# Patient Record
Sex: Female | Born: 1970 | Race: White | Hispanic: No | Marital: Married | Smoking: Never smoker
Health system: Southern US, Community
[De-identification: ages and names within clinical notes are randomized; demographics above are authoritative.]

## PROBLEM LIST (undated history)

## (undated) ENCOUNTER — Ambulatory Visit: Payer: Self-pay

## (undated) DIAGNOSIS — G43909 Migraine, unspecified, not intractable, without status migrainosus: Secondary | ICD-10-CM

## (undated) DIAGNOSIS — F419 Anxiety disorder, unspecified: Secondary | ICD-10-CM

## (undated) DIAGNOSIS — N809 Endometriosis, unspecified: Secondary | ICD-10-CM

## (undated) DIAGNOSIS — N951 Menopausal and female climacteric states: Secondary | ICD-10-CM

## (undated) DIAGNOSIS — R102 Pelvic and perineal pain: Secondary | ICD-10-CM

## (undated) DIAGNOSIS — M041 Periodic fever syndromes: Secondary | ICD-10-CM

## (undated) HISTORY — DX: Endometriosis, unspecified: N80.9

## (undated) HISTORY — PX: ABDOMINAL HYSTERECTOMY: SHX81

## (undated) HISTORY — PX: LAPAROSCOPIC LYSIS OF ADHESIONS: SHX5905

## (undated) HISTORY — PX: APPENDECTOMY: SHX54

## (undated) HISTORY — PX: CHOLECYSTECTOMY: SHX55

## (undated) HISTORY — DX: Pelvic and perineal pain: R10.2

## (undated) HISTORY — DX: Periodic fever syndromes: M04.1

## (undated) HISTORY — DX: Anxiety disorder, unspecified: F41.9

## (undated) HISTORY — DX: Menopausal and female climacteric states: N95.1

---

## 2004-10-19 ENCOUNTER — Inpatient Hospital Stay: Payer: Self-pay | Admitting: Surgery

## 2005-07-28 ENCOUNTER — Emergency Department: Payer: Self-pay | Admitting: Emergency Medicine

## 2005-07-28 ENCOUNTER — Emergency Department (HOSPITAL_COMMUNITY): Admission: EM | Admit: 2005-07-28 | Discharge: 2005-07-29 | Payer: Self-pay | Admitting: Emergency Medicine

## 2007-07-24 ENCOUNTER — Ambulatory Visit: Payer: Self-pay | Admitting: Unknown Physician Specialty

## 2007-07-30 ENCOUNTER — Ambulatory Visit: Payer: Self-pay | Admitting: Unknown Physician Specialty

## 2008-06-21 ENCOUNTER — Ambulatory Visit: Payer: Self-pay | Admitting: Gastroenterology

## 2011-09-17 ENCOUNTER — Ambulatory Visit: Payer: Self-pay | Admitting: Obstetrics and Gynecology

## 2012-03-28 ENCOUNTER — Emergency Department: Payer: Self-pay | Admitting: *Deleted

## 2012-12-09 ENCOUNTER — Ambulatory Visit: Payer: Self-pay | Admitting: Internal Medicine

## 2012-12-28 ENCOUNTER — Ambulatory Visit: Payer: Self-pay | Admitting: Gastroenterology

## 2012-12-30 LAB — PATHOLOGY REPORT

## 2013-01-28 LAB — HM PAP SMEAR: HM Pap smear: NEGATIVE

## 2013-08-11 HISTORY — PX: OTHER SURGICAL HISTORY: SHX169

## 2013-08-31 ENCOUNTER — Ambulatory Visit: Payer: Self-pay | Admitting: Obstetrics and Gynecology

## 2013-08-31 LAB — CBC
HCT: 44.6 % (ref 35.0–47.0)
MCHC: 34.8 g/dL (ref 32.0–36.0)
MCV: 89 fL (ref 80–100)
Platelet: 276 10*3/uL (ref 150–440)
RBC: 5.04 10*6/uL (ref 3.80–5.20)
WBC: 6.5 10*3/uL (ref 3.6–11.0)

## 2013-08-31 LAB — BASIC METABOLIC PANEL
Chloride: 105 mmol/L (ref 98–107)
Co2: 30 mmol/L (ref 21–32)
Creatinine: 0.62 mg/dL (ref 0.60–1.30)
EGFR (African American): >60
EGFR (Non-African Amer.): >60
Glucose: 74 mg/dL (ref 65–99)

## 2013-09-06 ENCOUNTER — Ambulatory Visit: Payer: Self-pay | Admitting: Obstetrics and Gynecology

## 2013-09-07 LAB — HEMOGLOBIN: HGB: 12.6 g/dL (ref 12.0–16.0)

## 2013-09-08 LAB — PATHOLOGY REPORT

## 2013-09-11 ENCOUNTER — Emergency Department: Payer: Self-pay | Admitting: Emergency Medicine

## 2013-09-11 LAB — COMPREHENSIVE METABOLIC PANEL
BUN: 8 mg/dL (ref 7–18)
Co2: 30 mmol/L (ref 21–32)
EGFR (African American): >60
EGFR (Non-African Amer.): >60
Glucose: 85 mg/dL (ref 65–99)
Osmolality: 270 (ref 275–301)
Sodium: 136 mmol/L (ref 136–145)
Total Protein: 6.7 g/dL (ref 6.4–8.2)

## 2013-09-11 LAB — CBC
HGB: 12.7 g/dL (ref 12.0–16.0)
MCH: 30.4 pg (ref 26.0–34.0)
MCHC: 34.9 g/dL (ref 32.0–36.0)
MCV: 87 fL (ref 80–100)
Platelet: 232 10*3/uL (ref 150–440)
RBC: 4.19 10*6/uL (ref 3.80–5.20)
RDW: 12.9 % (ref 11.5–14.5)

## 2013-09-11 LAB — LIPASE, BLOOD: Lipase: 90 U/L (ref 73–393)

## 2013-09-11 LAB — URINALYSIS, COMPLETE: Protein: NEGATIVE

## 2013-09-11 LAB — WET PREP, GENITAL

## 2014-02-08 ENCOUNTER — Emergency Department: Payer: Self-pay | Admitting: Emergency Medicine

## 2015-02-20 LAB — HM MAMMOGRAPHY

## 2015-03-03 NOTE — Op Note (Signed)
PATIENT NAME:  Heidi Pitts, [name] MR#:  [id number] DATE OF BIRTH:  13-Mar-1971  DATE OF PROCEDURE:  09/06/2013  PREOPERATIVE DIAGNOSES:  Chronic pelvic pain secondary to endometriosis.   POSTOPERATIVE DIAGNOSIS:  Chronic pelvic pain secondary to endometriosis.   OPERATION:  Vaginal trachelectomy.   SURGEON: Prentice DockerMartin A. Rhyder Koegel, M.D.   FIRST ASSISTANT: Dr. Valentino Saxonherry and Ardeen JourdainSeth Abel PA-S.   ANESTHESIA: General LMA.   INDICATIONS: The patient is a 44 year old white female, previously status post LSH/RSO for symptomatic endometriosis, presents now for further surgical management of chronic pelvic pain. She has developed vaginal bleeding and pelvic pain associated to bleeding. She desires treatment through trachelectomy.   FINDINGS AT SURGERY: Revealed a grossly normal-appearing cervix.   DESCRIPTION OF PROCEDURE: The patient was brought to the Operating Room where she was placed in the supine position. General anesthesia was induced without difficulty. The LMA technique was used. The patient was placed in the dorsal lithotomy position and a Betadine perineal, intravaginal prep and drape was performed in standard fashion. A Foley catheter was placed into the bladder and was draining clear yellow urine. A weighted speculum was placed into the vagina and double-tooth tenaculum was placed onto the cervix. Posterior colpotomy was made with Mayo scissors. Uterosacral ligaments were clamped, cut, and stick tied. These were tagged. The posterior cuff was run with a running locking stitch of 0 Vicryl suture. The cervix was circumscribed with the Bovie cautery. The bladder was dissected off the cervix through sharp and blunt dissection. Sequentially, the remaining cardinal broad ligament complexes were clamped, cut, and stick tied. Cervix was removed from the operative field. No significant pelvic adhesions were identified. The vagina was then closed using simple interrupted technique of 2-0 chromic suture. Upon  completion of the procedure,  all instrumentation was removed from the vagina. The patient was then awakened, mobilized, and taken to the recovery room in satisfactory condition.   ESTIMATED BLOOD LOSS: 75 mL   IV FLUIDS: Were 1 liter.  URINE OUTPUT: Not quantified at the time of this dictation.   All instruments, needle, and sponge counts were verified as correct. The patient did receive clindamycin and gentamicin antibiotic prophylaxis.   ____________________________ Prentice DockerMartin A. Mayumi Summerson, MD mad:cc D: 09/06/2013 16:44:01 ET T: 09/07/2013 00:43:23 ET JOB#: [id number]  cc: Daphine DeutscherMartin A. Raychelle Hudman, MD, <Dictator> [facility] Prentice DockerMARTIN A Onyekachi Gathright MD ELECTRONICALLY SIGNED 09/07/2013 13:33

## 2015-10-31 ENCOUNTER — Ambulatory Visit
Admit: 2015-10-31 | Discharge: 2015-10-31 | Disposition: A | Discharge status: Home / Self Care | Payer: BC Managed Care – PPO | Attending: Family Medicine | Admitting: Family Medicine

## 2015-10-31 ENCOUNTER — Ambulatory Visit
Admission: EM | Admit: 2015-10-31 | Discharge: 2015-10-31 | Disposition: A | Discharge status: Home / Self Care | Payer: BC Managed Care – PPO | Attending: Family Medicine | Admitting: Family Medicine

## 2015-10-31 ENCOUNTER — Encounter: Payer: Self-pay | Admitting: *Deleted

## 2015-10-31 DIAGNOSIS — B9689 Other specified bacterial agents as the cause of diseases classified elsewhere: Secondary | ICD-10-CM

## 2015-10-31 DIAGNOSIS — K5900 Constipation, unspecified: Secondary | ICD-10-CM | POA: Insufficient documentation

## 2015-10-31 DIAGNOSIS — K5730 Diverticulosis of large intestine without perforation or abscess without bleeding: Secondary | ICD-10-CM | POA: Diagnosis not present

## 2015-10-31 DIAGNOSIS — Z90710 Acquired absence of both cervix and uterus: Secondary | ICD-10-CM | POA: Insufficient documentation

## 2015-10-31 DIAGNOSIS — A499 Bacterial infection, unspecified: Secondary | ICD-10-CM

## 2015-10-31 DIAGNOSIS — R1032 Left lower quadrant pain: Secondary | ICD-10-CM | POA: Insufficient documentation

## 2015-10-31 DIAGNOSIS — N760 Acute vaginitis: Secondary | ICD-10-CM | POA: Insufficient documentation

## 2015-10-31 DIAGNOSIS — K7689 Other specified diseases of liver: Secondary | ICD-10-CM | POA: Diagnosis not present

## 2015-10-31 HISTORY — DX: Migraine, unspecified, not intractable, without status migrainosus: G43.909

## 2015-10-31 LAB — WET PREP, GENITAL
Sperm: NONE SEEN
TRICH WET PREP: NONE SEEN
YEAST WET PREP: NONE SEEN

## 2015-10-31 LAB — COMPREHENSIVE METABOLIC PANEL
ALBUMIN: 4.6 g/dL (ref 3.5–5.0)
ALT: 12 U/L — ABNORMAL LOW (ref 14–54)
ANION GAP: 9 (ref 5–15)
AST: 18 U/L (ref 15–41)
Alkaline Phosphatase: 59 U/L (ref 38–126)
BILIRUBIN TOTAL: 0.5 mg/dL (ref 0.3–1.2)
BUN: 12 mg/dL (ref 6–20)
CHLORIDE: 104 mmol/L (ref 101–111)
CO2: 25 mmol/L (ref 22–32)
Calcium: 9.3 mg/dL (ref 8.9–10.3)
Creatinine, Ser: 0.59 mg/dL (ref 0.44–1.00)
GFR calc Af Amer: >60 mL/min (ref >60)
Glucose, Bld: 96 mg/dL (ref 65–99)
POTASSIUM: 3.6 mmol/L (ref 3.5–5.1)
Sodium: 138 mmol/L (ref 135–145)
TOTAL PROTEIN: 7.5 g/dL (ref 6.5–8.1)

## 2015-10-31 LAB — URINALYSIS COMPLETE WITH MICROSCOPIC (ARMC ONLY)
BILIRUBIN URINE: NEGATIVE
Bacteria, UA: NONE SEEN
Glucose, UA: NEGATIVE mg/dL
HGB URINE DIPSTICK: NEGATIVE
KETONES UR: NEGATIVE mg/dL
LEUKOCYTES UA: NEGATIVE
NITRITE: NEGATIVE
PH: 7.5 (ref 5.0–8.0)
PROTEIN: NEGATIVE mg/dL
SPECIFIC GRAVITY, URINE: 1.02 (ref 1.005–1.030)
WBC, UA: NONE SEEN WBC/hpf (ref 0–5)

## 2015-10-31 LAB — LIPASE, BLOOD: Lipase: 25 U/L (ref 11–51)

## 2015-10-31 LAB — AMYLASE: AMYLASE: 45 U/L (ref 28–100)

## 2015-10-31 LAB — CBC WITH DIFFERENTIAL/PLATELET
BASOS ABS: 0 10*3/uL (ref 0–0.1)
BASOS PCT: 1 %
EOS PCT: 1 %
Eosinophils Absolute: 0.1 10*3/uL (ref 0–0.7)
HEMATOCRIT: 39 % (ref 35.0–47.0)
Hemoglobin: 13.2 g/dL (ref 12.0–16.0)
Lymphocytes Relative: 13 %
Lymphs Abs: 0.9 10*3/uL — ABNORMAL LOW (ref 1.0–3.6)
MCH: 30.5 pg (ref 26.0–34.0)
MCHC: 33.7 g/dL (ref 32.0–36.0)
MCV: 90.3 fL (ref 80.0–100.0)
MONO ABS: 0.4 10*3/uL (ref 0.2–0.9)
MONOS PCT: 5 %
NEUTROS ABS: 5.5 10*3/uL (ref 1.4–6.5)
Neutrophils Relative %: 80 %
PLATELETS: 189 10*3/uL (ref 150–440)
RBC: 4.32 MIL/uL (ref 3.80–5.20)
RDW: 13.2 % (ref 11.5–14.5)
WBC: 6.8 10*3/uL (ref 3.6–11.0)

## 2015-10-31 LAB — CHLAMYDIA/NGC RT PCR (ARMC ONLY)
Chlamydia Tr: NOT DETECTED
N gonorrhoeae: NOT DETECTED

## 2015-10-31 MED ORDER — KETOROLAC TROMETHAMINE 60 MG/2ML IM SOLN
30.0000 mg | Freq: Once | INTRAMUSCULAR | Status: AC
  Administered 2015-10-31: 30 mg via INTRAMUSCULAR

## 2015-10-31 MED ORDER — MELOXICAM 15 MG PO TABS: 15.0000 mg | ORAL_TABLET | Freq: Every day | ORAL | Status: DC

## 2015-10-31 MED ORDER — IOHEXOL 300 MG/ML  SOLN
100.0000 mL | Freq: Once | INTRAMUSCULAR | Status: AC | PRN
  Administered 2015-10-31: 100 mL via INTRAVENOUS

## 2015-10-31 MED ORDER — HYDROCODONE-ACETAMINOPHEN 5-325 MG PO TABS: 1.0000 | ORAL_TABLET | Freq: Three times a day (TID) | ORAL | Status: DC | PRN

## 2015-10-31 MED ORDER — METRONIDAZOLE 500 MG PO TABS: 500.0000 mg | ORAL_TABLET | Freq: Two times a day (BID) | ORAL | Status: DC

## 2015-10-31 NOTE — ED Provider Notes (Signed)
CSN: [id number]     Arrival date & time 10/31/15  1058 History   First MD Initiated Contact with Patient 10/31/15 1159     Chief Complaint  Patient presents with  . Back Pain    lower left side   (Consider location/radiation/quality/duration/timing/severity/associated sxs/prior Treatment) HPI   Heidi Pitts is a 44 y.o. female presenting for L back pain x3 days.  Pain is constant, sharp and cramping and came on suddenly 3 days ago.  Walking seems to make pain more tolerable, but tylenol, advil, resting have not improved pain.  Pain is worse with laying down on back.  She thinks she is urinating a little more frequently than usual with no increase in volume.  She saw blood in toilet with urine yesterday, but thinks this may have come from a hemorrhoid that she knows she has. She has been having loose stools for about 3 wks, which she thinks is related to stress, as this commonly happens to her.  She has had nausea this AM and vomited once.  Good PO intake. Patient denies any fever, dysuria, abd pain (but reports that pain feels like it is between mid left abd and back).  She has previously had complete hysterectomy (left ovary remains), appendectomy, and cholecystectomy.  Denies any history of nephrolithiasis or pyelonephritis.  Past Medical History  Diagnosis Date  . Migraines    Past Surgical History  Procedure Laterality Date  . Appendectomy    . Cholecystectomy    . Abdominal hysterectomy     History reviewed. No pertinent family history. Social History  Substance Use Topics  . Smoking status: Never Smoker   . Smokeless tobacco: Never Used  . Alcohol Use: No   OB History    No data available     Review of Systems  Constitutional: Negative for fever and chills.  HENT: Negative.   Eyes: Negative.   Respiratory: Negative for cough, chest tightness, shortness of breath and wheezing.   Cardiovascular: Negative for chest pain and leg swelling.  Gastrointestinal: Positive  for nausea, vomiting, diarrhea and blood in stool. Negative for abdominal pain and constipation.  Endocrine: Negative.   Genitourinary: Positive for frequency. Negative for dysuria, urgency, hematuria, vaginal bleeding, vaginal discharge and pelvic pain.  Musculoskeletal: Positive for back pain. Negative for myalgias, joint swelling, neck pain and neck stiffness.  Skin: Negative for rash.  All other systems reviewed and are negative.   Allergies  Doxycycline; Fish allergy; and Penicillins  Home Medications   Prior to Admission medications   Medication Sig Start Date End Date Taking? Authorizing Provider  promethazine (PHENERGAN) 25 MG tablet Take 25 mg by mouth every 6 (six) hours as needed for nausea or vomiting.   Yes Historical Provider, MD  ZOLMitriptan (ZOMIG) 2.5 MG tablet Take 2.5 mg by mouth as needed for migraine or headache. May repeat in 2 hours if headache persists or recurs.   Yes Historical Provider, MD  metroNIDAZOLE (FLAGYL) 500 MG tablet Take 1 tablet (500 mg total) by mouth 2 (two) times daily. 10/31/15   Erasmo Downer, MD   Meds Ordered and Administered this Visit   Medications  ketorolac (TORADOL) injection 30 mg (not administered)    BP 130/77 mmHg  Pulse 70  Temp(Src) 98 F (36.7 C) (Oral)  Resp 20  Ht  (1.626 m)  Wt 142 lb (64.411 kg)  BMI 24.36 kg/m2  SpO2 100% No data found.   Physical Exam  Constitutional: She is oriented to  person, place, and time. She appears well-developed and well-nourished. No distress.  HENT:  Head: Normocephalic and atraumatic.  Mouth/Throat: Oropharynx is clear and moist.  Eyes: Conjunctivae and EOM are normal. Pupils are equal, round, and reactive to light.  Neck: Normal range of motion. Neck supple.  Cardiovascular: Normal rate, regular rhythm, normal heart sounds and intact distal pulses.   No murmur heard. Pulmonary/Chest: Effort normal and breath sounds normal. No respiratory distress. She has no wheezes.   Abdominal: Soft. Bowel sounds are normal. She exhibits no distension. There is no rebound and no guarding.  TTP in LLQ No CVA Tenderness  Genitourinary:  External genitalia within normal limits.  Vaginal mucosa pink, moist, normal rugae.  No cervix present, no discharge or bleeding noted on speculum exam.  Bimanual exam revealed no adnexal masses or tenderness bilaterally.    Musculoskeletal:  TTP over L mid back, no CVA Tenderness  Lymphadenopathy:    She has no cervical adenopathy.  Neurological: She is alert and oriented to person, place, and time.  Skin: Skin is warm and dry. No rash noted.  Psychiatric: She has a normal mood and affect. Her behavior is normal.    ED Course  Procedures (including critical care time)  Labs Review Labs Reviewed  WET PREP, GENITAL - Abnormal; Notable for the following:    Clue Cells Wet Prep HPF POC FEW (*)    WBC, Wet Prep HPF POC FEW (*)    All other components within normal limits  URINALYSIS COMPLETEWITH MICROSCOPIC ([facility] ONLY) - Abnormal; Notable for the following:    Squamous Epithelial / LPF 0-5 (*)    All other components within normal limits  CBC WITH DIFFERENTIAL/PLATELET - Abnormal; Notable for the following:    Lymphs Abs 0.9 (*)    All other components within normal limits  COMPREHENSIVE METABOLIC PANEL - Abnormal; Notable for the following:    ALT 12 (*)    All other components within normal limits  CHLAMYDIA/NGC RT PCR ([facility] ONLY)  AMYLASE  LIPASE, BLOOD    CBC : 6.8>13.2/39<189  Imaging Review No results found.   Visual Acuity Review  Right Eye Distance:   Left Eye Distance:   Bilateral Distance:    Right Eye Near:   Left Eye Near:    Bilateral Near:         MDM   1. LLQ abdominal pain   2. Bacterial vaginosis     No evidence of UTI, pyelo, nephrolithiasis given neg UA and persistent LLQ abd TTP on exam.  Concern for possible ovarian cyst vs diverticulitis.  CBC, CMP, lipase, amylase  unremarkable.  Will send for outpatient CT abd/pelvis with contrast to evaluate for diverticulitis and L ovarian cyst.  VSS and afebrile currently.  IM Toradol 30mg  x1 given prior to discharge from Urgent Care.    Incidentally noted BV on Wet prep, will treat with Flagyl x7d.  GC/CT pending, will follow-up - though of note, patient could not have PID causing pain given remote hysterectomy.    Erasmo DownerAngela M Lakeisa Heninger, MD, MPH PGY-2,  [facility] 10/31/2015 1:40 PM    Erasmo DownerAngela M Ren Aspinall, MD 10/31/15 [phone]

## 2015-10-31 NOTE — Discharge Instructions (Signed)

## 2015-10-31 NOTE — ED Notes (Signed)
Patient started having sudden back pain on the left side which radiates to the front 3 days ago. Patient reports possible blood in urine but is unsure due to actively bleeding hemorrhoids. No history of kidney stones.

## 2015-10-31 NOTE — ED Provider Notes (Addendum)
CSN: [id number]     Arrival date & time 10/31/15  1058 History   First MD Initiated Contact with Patient 10/31/15 1159     Chief Complaint  Patient presents with  . Back Pain    lower left side   (Consider location/radiation/quality/duration/timing/severity/associated sxs/prior Treatment) HPI  Past Medical History  Diagnosis Date  . Migraines    Past Surgical History  Procedure Laterality Date  . Appendectomy    . Cholecystectomy    . Abdominal hysterectomy     History reviewed. No pertinent family history. Social History  Substance Use Topics  . Smoking status: Never Smoker   . Smokeless tobacco: Never Used  . Alcohol Use: No   OB History    No data available     Review of Systems  Allergies  Doxycycline; Fish allergy; and Penicillins  Home Medications   Prior to Admission medications   Medication Sig Start Date End Date Taking? Authorizing Provider  promethazine (PHENERGAN) 25 MG tablet Take 25 mg by mouth every 6 (six) hours as needed for nausea or vomiting.   Yes Historical Provider, MD  ZOLMitriptan (ZOMIG) 2.5 MG tablet Take 2.5 mg by mouth as needed for migraine or headache. May repeat in 2 hours if headache persists or recurs.   Yes Historical Provider, MD  HYDROcodone-acetaminophen (NORCO) 5-325 MG tablet Take 1 tablet by mouth every 8 (eight) hours as needed for moderate pain. 10/31/15   Hassan Rowan, MD  meloxicam (MOBIC) 15 MG tablet Take 1 tablet (15 mg total) by mouth daily. 10/31/15   Hassan Rowan, MD  metroNIDAZOLE (FLAGYL) 500 MG tablet Take 1 tablet (500 mg total) by mouth 2 (two) times daily. 10/31/15   Erasmo Downer, MD   Meds Ordered and Administered this Visit   Medications  ketorolac (TORADOL) injection 30 mg (30 mg Intramuscular Given 10/31/15 1341)    BP 130/77 mmHg  Pulse 70  Temp(Src) 98 F (36.7 C) (Oral)  Resp 20  Ht  (1.626 m)  Wt 142 lb (64.411 kg)  BMI 24.36 kg/m2  SpO2 100% No data found.   Physical Exam  ED  Course  Procedures (including critical care time)  Labs Review Labs Reviewed  WET PREP, GENITAL - Abnormal; Notable for the following:    Clue Cells Wet Prep HPF POC FEW (*)    WBC, Wet Prep HPF POC FEW (*)    All other components within normal limits  URINALYSIS COMPLETEWITH MICROSCOPIC ([facility] ONLY) - Abnormal; Notable for the following:    Squamous Epithelial / LPF 0-5 (*)    All other components within normal limits  CBC WITH DIFFERENTIAL/PLATELET - Abnormal; Notable for the following:    Lymphs Abs 0.9 (*)    All other components within normal limits  COMPREHENSIVE METABOLIC PANEL - Abnormal; Notable for the following:    ALT 12 (*)    All other components within normal limits  CHLAMYDIA/NGC RT PCR ([facility] ONLY)  AMYLASE  LIPASE, BLOOD    Imaging Review Ct Abdomen Pelvis W Contrast  10/31/2015  CLINICAL DATA:  Left lower quadrant pain. EXAM: CT ABDOMEN AND PELVIS WITH CONTRAST TECHNIQUE: Multidetector CT imaging of the abdomen and pelvis was performed using the standard protocol following bolus administration of intravenous contrast. CONTRAST:  OMNIPAQUE IOHEXOL 300 MG/ML  SOLN COMPARISON:  None. FINDINGS: Lower chest:  Lung bases are clear. Hepatobiliary: Postop cholecystectomy. Bile ducts nondilated. Small subcentimeter hepatic cysts. No liver mass lesion. Pancreas: Negative Spleen: Negative Adrenals/Urinary Tract: Negative  Stomach/Bowel: Negative for bowel obstruction. No bowel edema. No evidence of diverticulitis. Mild sigmoid diverticulosis Mild constipation. Vascular/Lymphatic: Normal aorta.  No lymphadenopathy. Reproductive: Postop hysterectomy. Left ovary normal. No pelvic mass lesion. Other: Negative for free fluid Musculoskeletal: No focal bony abnormality. IMPRESSION: Mild sigmoid diverticulosis. Negative for diverticulitis. No acute abnormality. Electronically Signed   By: Marlan Palauharles  Clark M.D.   On: 10/31/2015 16:11     Visual Acuity Review  Right Eye  Distance:   Left Eye Distance:   Bilateral Distance:    Right Eye Near:   Left Eye Near:    Bilateral Near:         MDM   1. LLQ abdominal pain   2. Bacterial vaginosis    Patient was seen with Dr. Leonard SchwartzB. Case was discussed with her. When patient was examined by me she had tenderness in the right left lower quadrant and mild CVA tenderness. She was having marked amount of tenderness as well. Because of the amount of tenderness and pain she had a CT scan which was negative for any acute findings. After discussion with me she requested something for pain and [facility] Vicodin and Mobic. She was also placed on Flagyl after Dr. B defined her to have clue cells and the vaginal secretions.    Hassan RowanEugene Jerika Wales, MD 10/31/15 1714  Hassan RowanEugene Jerolene Kupfer, MD 10/31/15 2126

## 2015-12-25 DIAGNOSIS — K5730 Diverticulosis of large intestine without perforation or abscess without bleeding: Secondary | ICD-10-CM | POA: Insufficient documentation

## 2016-02-13 ENCOUNTER — Encounter: Payer: Self-pay | Admitting: Obstetrics and Gynecology

## 2016-02-13 ENCOUNTER — Ambulatory Visit (INDEPENDENT_AMBULATORY_CARE_PROVIDER_SITE_OTHER): Payer: BC Managed Care – PPO | Admitting: Obstetrics and Gynecology

## 2016-02-13 VITALS — BP 131/72 | HR 84 | Ht 64.0 in | Wt 164.0 lb

## 2016-02-13 DIAGNOSIS — Z1239 Encounter for other screening for malignant neoplasm of breast: Secondary | ICD-10-CM | POA: Diagnosis not present

## 2016-02-13 DIAGNOSIS — N809 Endometriosis, unspecified: Secondary | ICD-10-CM

## 2016-02-13 DIAGNOSIS — Z90710 Acquired absence of both cervix and uterus: Secondary | ICD-10-CM | POA: Diagnosis not present

## 2016-02-13 DIAGNOSIS — E785 Hyperlipidemia, unspecified: Secondary | ICD-10-CM | POA: Insufficient documentation

## 2016-02-13 DIAGNOSIS — Z0000 Encounter for general adult medical examination without abnormal findings: Secondary | ICD-10-CM | POA: Diagnosis not present

## 2016-02-13 DIAGNOSIS — G43909 Migraine, unspecified, not intractable, without status migrainosus: Secondary | ICD-10-CM | POA: Insufficient documentation

## 2016-02-13 DIAGNOSIS — Z01419 Encounter for gynecological examination (general) (routine) without abnormal findings: Secondary | ICD-10-CM

## 2016-02-13 DIAGNOSIS — Z90711 Acquired absence of uterus with remaining cervical stump: Secondary | ICD-10-CM

## 2016-02-13 NOTE — Progress Notes (Signed)
Patient ID: Heidi Pitts, female   DOB: 04-24-1971, 45 y.o.   MRN: [id number] ANNUAL PREVENTATIVE CARE GYN  ENCOUNTER NOTE  Subjective:       Heidi Pitts is a 45 y.o. G0P0000 female here for a routine annual gynecologic exam.  Current complaints: 1.  Breast tenderness- x 3 weeks-    Does not recall increasing intake of caffeinated beverages or chocolate. Has gained weight due to some depression following death in family. Still has one ovary. Status post LSH LSO; status post trachelectomy. Bowel and bladder function are normal. No pelvic pain   Gynecologic History No LMP recorded. Patient has had a hysterectomy. [facility] Contraception: status post hysterectomy Last Pap: 01/2013 neg. Results were: normal Last mammogram: birad 1 4/12/016. Results were: normal Status post trachelectomy  Obstetric History OB History  Gravida Para Term Preterm AB SAB TAB Ectopic Multiple Living  0 0 0 0 0 0 0 0 0 0         Past Medical History  Diagnosis Date  . Migraines   . Endometriosis   . Pelvic pain in female     Chronic rt lower qaudrant pain  . Perimenopausal vasomotor symptoms     Past Surgical History  Procedure Laterality Date  . Appendectomy    . Cholecystectomy    . Abdominal hysterectomy      rso  . Vaginal trachelectomy  08/2013  . Laparoscopic lysis of adhesions      Current Outpatient Prescriptions on File Prior to Visit  Medication Sig Dispense Refill  . promethazine (PHENERGAN) 25 MG tablet Take 25 mg by mouth every 6 (six) hours as needed for nausea or vomiting.    Marland Kitchen. ZOLMitriptan (ZOMIG) 2.5 MG tablet Take 2.5 mg by mouth as needed for migraine or headache. May repeat in 2 hours if headache persists or recurs.     No current facility-administered medications on file prior to visit.    Allergies  Allergen Reactions  . Doxycycline Rash  . Fish Allergy Anaphylaxis  . Penicillins Rash  . Amoxicillin-Pot Clavulanate Other (See Comments)     Social History   Social History  . Marital Status: Single    Spouse Name: N/A  . Number of Children: N/A  . Years of Education: N/A   Occupational History  . Not on file.   Social History Main Topics  . Smoking status: Never Smoker   . Smokeless tobacco: Never Used  . Alcohol Use: Yes     Comment: rare  . Drug Use: No  . Sexual Activity: Yes     Comment: same sex partner   Other Topics Concern  . Not on file   Social History Narrative    Family History  Problem Relation Age of Onset  . Heart disease Father   . Diabetes Father   . Bladder Cancer Maternal Aunt   . Breast cancer Paternal Aunt   . Pancreatic cancer Maternal Grandmother   . Colon cancer Maternal Grandmother   . Breast cancer Paternal Grandmother   . Ovarian cancer Neg Hx     The following portions of the patient's history were reviewed and updated as appropriate: allergies, current medications, past family history, past medical history, past social history, past surgical history and problem list.  Review of Systems ROS Review of Systems - General ROS: negative for - chills, fatigue, fever, hot flashes, night sweats, weight gain or weight loss Psychological ROS: negative for - anxiety, decreased libido, depression, mood swings,  physical abuse or sexual abuse Ophthalmic ROS: negative for - blurry vision, eye pain or loss of vision ENT ROS: negative for - headaches, hearing change, visual changes or vocal changes Allergy and Immunology ROS: negative for - hives, itchy/watery eyes or seasonal allergies Hematological and Lymphatic ROS: negative for - bleeding problems, bruising, swollen lymph nodes or weight loss Endocrine ROS: negative for - galactorrhea, hair pattern changes, hot flashes, malaise/lethargy, mood swings, palpitations, polydipsia/polyuria, skin changes, temperature intolerance or unexpected weight changes Breast ROS: negative for - new or changing breast lumps or nipple  discharge Respiratory ROS: negative for - cough or shortness of breath Cardiovascular ROS: negative for - chest pain, irregular heartbeat, palpitations or shortness of breath Gastrointestinal ROS: no abdominal pain, change in bowel habits, or black or bloody stools Genito-Urinary ROS: no dysuria, trouble voiding, or hematuria Musculoskeletal ROS: negative for - joint pain or joint stiffness Neurological ROS: negative for - bowel and bladder control changes Dermatological ROS: negative for rash and skin lesion changes   Objective:   BP 131/72 mmHg  Pulse 84  Ht  (1.626 m)  Wt 164 lb (74.39 kg)  BMI 28.14 kg/m2 CONSTITUTIONAL: Well-developed, well-nourished female in no acute distress.  PSYCHIATRIC: Normal mood and affect. Normal behavior. Normal judgment and thought content. NEUROLGIC: Alert and oriented to person, place, and time. Normal muscle tone coordination. No cranial nerve deficit noted. HENT:  Normocephalic, atraumatic, External right and left ear normal. Oropharynx is clear and moist EYES: Conjunctivae and EOM are normal. No scleral icterus.  NECK: Normal range of motion, supple, no masses.  Normal thyroid.  SKIN: Skin is warm and dry. No rash noted. Not diaphoretic. No erythema. No pallor. CARDIOVASCULAR: Normal heart rate noted, regular rhythm, no murmur. RESPIRATORY: Clear to auscultation bilaterally. Effort and breath sounds normal, no problems with respiration noted. BREASTS: Symmetric in size. No masses, skin changes, nipple drainage, or lymphadenopathy. ABDOMEN: Soft, normal bowel sounds, no distention noted.  No tenderness, rebound or guarding.  BLADDER: Normal PELVIC:  External Genitalia: Normal  BUS: Normal  Vagina: Normal; no masses or tenderness and vaginal cuff  Cervix: Surgically absent  Uterus: Surgically absent  Adnexa: Normal  RV: External Exam NormaI, No Rectal Masses and Normal Sphincter tone  MUSCULOSKELETAL: Normal range of motion. No tenderness.   No cyanosis, clubbing, or edema.  2+ distal pulses. LYMPHATIC: No Axillary, Supraclavicular, or Inguinal Adenopathy.    Assessment:   Annual gynecologic examination 45 y.o. Contraception: status post hysterectomy status post [facility] RSO; status post vaginal trachelectomy [id number] Endometriosis, quiet  Plan:  Pap: Not needed Mammogram: Ordered Stool Guaiac Testing:  Not Indicated Labs:thur pcp Routine preventative health maintenance measures emphasized: Exercise/Diet/Weight control, Tobacco Warnings and Alcohol/Substance use risks  Lab work through Dr. Graciela Husbands Return to Clinic - 1 Year   [place], CMA Herold Harms, MD  Note: This dictation was prepared with Dragon dictation along with smaller phrase technology. Any transcriptional errors that result from this process are unintentional.

## 2016-02-13 NOTE — Patient Instructions (Signed)
1. No Pap smear necessary. 2. Mammogram ordered 3. Continue with healthy eating and exercise. 4. Return in 1 year for annual exam 5. Screening lab work per Dr. Graciela HusbandsKlein

## 2017-10-07 ENCOUNTER — Encounter: Payer: Self-pay | Admitting: Emergency Medicine

## 2017-10-07 ENCOUNTER — Ambulatory Visit
Admission: EM | Admit: 2017-10-07 | Discharge: 2017-10-07 | Disposition: A | Discharge status: Home / Self Care | Payer: BC Managed Care – PPO | Attending: Emergency Medicine | Admitting: Emergency Medicine

## 2017-10-07 ENCOUNTER — Other Ambulatory Visit: Payer: Self-pay

## 2017-10-07 DIAGNOSIS — G43009 Migraine without aura, not intractable, without status migrainosus: Secondary | ICD-10-CM

## 2017-10-07 DIAGNOSIS — R110 Nausea: Secondary | ICD-10-CM

## 2017-10-07 MED ORDER — DIPHENHYDRAMINE HCL 50 MG PO CAPS: 50.0000 mg | ORAL_CAPSULE | Freq: Once | ORAL | Status: DC

## 2017-10-07 MED ORDER — METOCLOPRAMIDE HCL 5 MG/ML IJ SOLN
10.0000 mg | Freq: Once | INTRAMUSCULAR | Status: AC
  Administered 2017-10-07: 10 mg via INTRAVENOUS

## 2017-10-07 MED ORDER — SODIUM CHLORIDE 0.9 % IV BOLUS (SEPSIS)
500.0000 mL | Freq: Once | INTRAVENOUS | Status: AC
  Administered 2017-10-07: 500 mL via INTRAVENOUS

## 2017-10-07 MED ORDER — METOCLOPRAMIDE HCL 10 MG PO TABS: 10.0000 mg | ORAL_TABLET | Freq: Four times a day (QID) | ORAL | 0 refills | Status: DC

## 2017-10-07 MED ORDER — BUTALBITAL-APAP-CAFFEINE 50-325-40 MG PO TABS: 1.0000 | ORAL_TABLET | ORAL | 0 refills | Status: DC | PRN

## 2017-10-07 MED ORDER — DEXAMETHASONE SODIUM PHOSPHATE 10 MG/ML IJ SOLN
10.0000 mg | Freq: Once | INTRAMUSCULAR | Status: AC
  Administered 2017-10-07: 10 mg via INTRAVENOUS

## 2017-10-07 MED ORDER — IBUPROFEN 600 MG PO TABS: 600.0000 mg | ORAL_TABLET | Freq: Four times a day (QID) | ORAL | 0 refills | Status: DC | PRN

## 2017-10-07 MED ORDER — KETOROLAC TROMETHAMINE 30 MG/ML IJ SOLN
30.0000 mg | Freq: Once | INTRAMUSCULAR | Status: AC
  Administered 2017-10-07: 30 mg via INTRAVENOUS

## 2017-10-07 NOTE — Discharge Instructions (Signed)
600 mg ibuprofen with 500 mg of Tylenol 3-4 times a [place].  Take the Fioricet for severe headache only.  Do not take more than 6 capsules a [place].  If you start feeling restless after taking the Reglan, take 25-50 mg of Benadryl.

## 2017-10-07 NOTE — ED Provider Notes (Signed)
HPI  SUBJECTIVE:  Heidi Pitts is a 46 y.o. female who reports gradual onset, unilateral pounding, throbbing headache starting at 2 this morning.  States it is located behind her left eye.  She reports nausea, 6 episodes of emesis, photophobia, phonophobia.  She reports intermittently blurry vision which is consistent with previous migraines.  She tried Zomig, Phenergan, Tylenol, ice, heat without improvement in her symptoms.  Symptoms are worse with light and noise.  No fevers, rash, neck stiffness.  No nasal congestion, sinus pain or pressure, dental pain, ear pain, TMJ pain.  No face droop, arm or leg weakness, aphasia, dysarthria, discoordination.  No seizures, syncope.  This is not the first or worst headache of her life.  It did not occur with exertion.  This is identical to previous migraines, however it is not responding to her usual medicines.  She also has a past medical history of sinusitis.  No history of stroke, HIV, atrial fibrillation.  LMP: Hysterectomy.  PMD: Lynnea FerrierKlein, Bert J III, MD    Past Medical History:  Diagnosis Date  . Endometriosis   . Migraines   . Pelvic pain in female    Chronic rt lower qaudrant pain  . Perimenopausal vasomotor symptoms     Past Surgical History:  Procedure Laterality Date  . ABDOMINAL HYSTERECTOMY     rso  . APPENDECTOMY    . CHOLECYSTECTOMY    . LAPAROSCOPIC LYSIS OF ADHESIONS    . vaginal trachelectomy  08/2013    Family History  Problem Relation Age of Onset  . Heart disease Father   . Diabetes Father   . Bladder Cancer Maternal Aunt   . Breast cancer Paternal Aunt   . Pancreatic cancer Maternal Grandmother   . Colon cancer Maternal Grandmother   . Breast cancer Paternal Grandmother   . Ovarian cancer Neg Hx     Social History   Tobacco Use  . Smoking status: Never Smoker  . Smokeless tobacco: Never Used  Substance Use Topics  . Alcohol use: Yes    Comment: rare  . Drug use: No    No current facility-administered  medications for this encounter.   Current Outpatient Medications:  .  promethazine (PHENERGAN) 25 MG tablet, Take 25 mg by mouth every 6 (six) hours as needed for nausea or vomiting., Disp: , Rfl:  .  topiramate (TOPAMAX) 25 MG tablet, TK 1 T PO Q NIGHT, Disp: , Rfl: 11 .  valACYclovir (VALTREX) 500 MG tablet, , Disp: , Rfl: 4 .  ZOLMitriptan (ZOMIG) 2.5 MG tablet, Take 2.5 mg by mouth as needed for migraine or headache. May repeat in 2 hours if headache persists or recurs., Disp: , Rfl:  .  butalbital-acetaminophen-caffeine (FIORICET, ESGIC) 50-325-40 MG tablet, Take 1-2 tablets by mouth every 4 (four) hours as needed for headache. Max 6 caps/[place], Disp: 20 tablet, Rfl: 0 .  ibuprofen (ADVIL,MOTRIN) 600 MG tablet, Take 1 tablet (600 mg total) by mouth every 6 (six) hours as needed., Disp: 30 tablet, Rfl: 0 .  metoCLOPramide (REGLAN) 10 MG tablet, Take 1 tablet (10 mg total) by mouth every 6 (six) hours., Disp: 30 tablet, Rfl: 0  Allergies  Allergen Reactions  . Doxycycline Rash  . Fish Allergy Anaphylaxis  . Penicillins Rash  . Amoxicillin-Pot Clavulanate Other (See Comments)     ROS  As noted in HPI.   Physical Exam  BP 102/60 (BP Location: Left Arm)   Pulse 66   Temp 97.8 F (36.6 C) (Oral)  Resp 14   Ht 5\' 4"  (1.626 m)   Wt 160 lb (72.6 kg)   SpO2 100%   BMI 27.46 kg/m   Constitutional: Well developed, well nourished, photophobic.  Lying in a darkened room. Eyes: PERRL, EOMI, conjunctiva normal bilaterally.  Unable to tolerate funduscopic HENT: Normocephalic, atraumatic,mucus membranes moist, normal dentition.  TM normal b/l. No TMJ tenderness. Normal dentition. No nasal congestion, + mild frontal sinus tenderness.  No maxillary sinus tenderness.  No temporal artery tenderness.  Neck: no cervical LN - trapezial muscle tenderness. No meningismus Respiratory: normal inspiratory effort Cardiovascular: Normal rate GI:  nondistended skin: No rash, skin  intact Musculoskeletal: No edema, no tenderness, no deformities Neurologic: Alert & oriented x 3, CN II-XII intact, romberg neg, finger-> nose, heel-> shin equal b/l, Romberg neg, tandem gait steady Psychiatric: Speech and behavior appropriate   ED Course  Medications  dexamethasone (DECADRON) injection 10 mg (10 mg Intravenous Given 10/07/17 0938)  metoCLOPramide (REGLAN) injection 10 mg (10 mg Intravenous Given 10/07/17 0930)  ketorolac (TORADOL) 30 MG/ML injection 30 mg (30 mg Intravenous Given 10/07/17 0934)  sodium chloride 0.9 % bolus 500 mL (0 mLs Intravenous Stopped 10/07/17 1000)    Orders Placed This Encounter  Procedures  . Insert peripheral IV    Standing Status:   Standing    Number of Occurrences:   1   No results found for this or any previous visit (from the past 24 hour(s)). No results found.   ED Clinical Impression  Migraine without aura and without status migrainosus, not intractable  ED Assessment/Plan  Pt describing typical pain, no sudden onset. Doubt SAH, ICH or space occupying lesion. Pt without fevers/chills, Pt has no meningeal sx, no nuchal rigidity. Doubt meningitis. Pt with normal neuro exam, no evidence of CVA/TIA.  Pt BP not elevated significantly, doubt hypertensive emergency. No evidence of temporal artery tenderness, no evidence of glaucoma or other ocular pathology. Will give headache cocktail (dexamethasone 10 IV,  Reglan 10 IV toradol 30 IV,), IVF since she has had 6 episodes of emesis and reassess.  [place] controlled substances [place] for this patient consulted and feel the risk/benefit ratio today is favorable for proceeding with this prescription for a controlled substance.   Pt much improved after medications. Pt with continued non-focal neuro exam. Will d/c home with nsaid,  fioricet, antiemetic, and have pt F/U with PCP. Discussed MDM, plan for follow up, signs and sx that should prompt return to ER. Pt agrees with plan  Meds  ordered this encounter  Medications  . DISCONTD: diphenhydrAMINE (BENADRYL) capsule 50 mg  . dexamethasone (DECADRON) injection 10 mg  . metoCLOPramide (REGLAN) injection 10 mg  . ketorolac (TORADOL) 30 MG/ML injection 30 mg  . sodium chloride 0.9 % bolus 500 mL  . butalbital-acetaminophen-caffeine (FIORICET, ESGIC) 50-325-40 MG tablet    Sig: Take 1-2 tablets by mouth every 4 (four) hours as needed for headache. Max 6 caps/[place]    Dispense:  20 tablet    Refill:  0  . metoCLOPramide (REGLAN) 10 MG tablet    Sig: Take 1 tablet (10 mg total) by mouth every 6 (six) hours.    Dispense:  30 tablet    Refill:  0  . ibuprofen (ADVIL,MOTRIN) 600 MG tablet    Sig: Take 1 tablet (600 mg total) by mouth every 6 (six) hours as needed.    Dispense:  30 tablet    Refill:  0    *This clinic note was created  using Scientist, clinical (histocompatibility and immunogenetics). Therefore, there may be occasional mistakes despite careful proofreading.  ?   Domenick Gong, MD 10/07/17 1259

## 2017-10-07 NOTE — ED Triage Notes (Addendum)
Patient c/o migraine headache that started around 2:00am this morning.  Patient reports nausea and vomiting. Patient has taken phenergan and Zomig this morning.

## 2018-05-06 ENCOUNTER — Encounter: Payer: Self-pay | Admitting: Obstetrics and Gynecology

## 2018-05-06 ENCOUNTER — Ambulatory Visit (INDEPENDENT_AMBULATORY_CARE_PROVIDER_SITE_OTHER): Payer: BC Managed Care – PPO | Admitting: Obstetrics and Gynecology

## 2018-05-06 VITALS — BP 110/67 | HR 74 | Ht 64.0 in | Wt 166.5 lb

## 2018-05-06 DIAGNOSIS — Z90710 Acquired absence of both cervix and uterus: Secondary | ICD-10-CM

## 2018-05-06 DIAGNOSIS — Z90711 Acquired absence of uterus with remaining cervical stump: Secondary | ICD-10-CM

## 2018-05-06 DIAGNOSIS — Z01419 Encounter for gynecological examination (general) (routine) without abnormal findings: Secondary | ICD-10-CM

## 2018-05-06 DIAGNOSIS — N809 Endometriosis, unspecified: Secondary | ICD-10-CM

## 2018-05-06 NOTE — Progress Notes (Signed)
Patient ID: Heidi Pitts, female   DOB: [birth year], 47 y.o.   MRN: [id number] ANNUAL PREVENTATIVE CARE GYN  ENCOUNTER NOTE  Subjective:       Heidi Pitts is a 47 y.o. [id number] female here for a routine annual gynecologic exam.  Current complaints: None.  Patient has been diagnosed with sporadic fever syndrome (autoimmune inflammatory disease) typically treated with high-dose steroids; she had 3 episodes of flulike syndromes with high fevers over the past year.  [name] reports no significant vasomotor symptoms.  No vaginal dryness. Still has one ovary. Status post [facility] LSO; status post trachelectomy. Bowel and bladder function are normal. No pelvic pain   Gynecologic History No LMP recorded. Patient has had a hysterectomy. Status post [facility] RSO Contraception: status post hysterectomy Last Pap: 01/2013 neg. Results were: normal Last mammogram 4/12/019.  bibc wnl Results were: normal Status post trachelectomy  Obstetric History OB History  Gravida Para Term Preterm AB Living  0 0 0 0 0 0  SAB TAB Ectopic Multiple Live Births  0 0 0 0      Past Medical History:  Diagnosis Date  . Endometriosis   . Migraines   . Pelvic pain in female    Chronic rt lower qaudrant pain  . Perimenopausal vasomotor symptoms     Past Surgical History:  Procedure Laterality Date  . ABDOMINAL HYSTERECTOMY     rso  . APPENDECTOMY    . CHOLECYSTECTOMY    . LAPAROSCOPIC LYSIS OF ADHESIONS    . vaginal trachelectomy  08/2013    Current Outpatient Medications on File Prior to Visit  Medication Sig Dispense Refill  . butalbital-acetaminophen-caffeine (FIORICET, ESGIC) 50-325-40 MG tablet Take 1-2 tablets by mouth every 4 (four) hours as needed for headache. Max 6 caps/[place] 20 tablet 0  . ibuprofen (ADVIL,MOTRIN) 600 MG tablet Take 1 tablet (600 mg total) by mouth every 6 (six) hours as needed. 30 tablet 0  . metoCLOPramide (REGLAN) 10 MG tablet Take 1 tablet (10 mg total) by mouth every 6 (six) hours. 30  tablet 0  . promethazine (PHENERGAN) 25 MG tablet Take 25 mg by mouth every 6 (six) hours as needed for nausea or vomiting.    . topiramate (TOPAMAX) 25 MG tablet TK 1 T PO Q NIGHT  11  . valACYclovir (VALTREX) 500 MG tablet   4  . ZOLMitriptan (ZOMIG) 2.5 MG tablet Take 2.5 mg by mouth as needed for migraine or headache. May repeat in 2 hours if headache persists or recurs.     No current facility-administered medications on file prior to visit.     Allergies  Allergen Reactions  . Doxycycline Rash  . Fish Allergy Anaphylaxis  . Penicillins Rash  . Amoxicillin-Pot Clavulanate Other (See Comments)    Social History   Socioeconomic History  . Marital status: Single    Spouse name: Not on file  . Number of children: Not on file  . Years of education: Not on file  . Highest education level: Not on file  Occupational History  . Not on file  Social Needs  . Financial resource strain: Not on file  . Food insecurity:    Worry: Not on file    Inability: Not on file  . Transportation needs:    Medical: Not on file    Non-medical: Not on file  Tobacco Use  . Smoking status: Never Smoker  . Smokeless tobacco: Never Used  Substance and Sexual Activity  . Alcohol use: Yes  Comment: rare  . Drug use: No  . Sexual activity: Yes    Comment: same sex partner  Lifestyle  . Physical activity:    Days per week: Not on file    Minutes per session: Not on file  . Stress: Not on file  Relationships  . Social connections:    Talks on phone: Not on file    Gets together: Not on file    Attends religious service: Not on file    Active member of club or organization: Not on file    Attends meetings of clubs or organizations: Not on file    Relationship status: Not on file  . Intimate partner violence:    Fear of current or ex partner: Not on file    Emotionally abused: Not on file    Physically abused: Not on file    Forced sexual activity: Not on file  Other Topics Concern  .  Not on file  Social History Narrative  . Not on file    Family History  Problem Relation Age of Onset  . Heart disease Father   . Diabetes Father   . Bladder Cancer Maternal Aunt   . Breast cancer Paternal Aunt   . Pancreatic cancer Maternal Grandmother   . Colon cancer Maternal Grandmother   . Breast cancer Paternal Grandmother   . Ovarian cancer Neg Hx     The following portions of the patient's history were reviewed and updated as appropriate: allergies, current medications, past family history, past medical history, past social history, past surgical history and problem list.  Review of Systems Review of Systems  Constitutional: Negative.   HENT: Negative.   Eyes: Negative.   Respiratory: Negative.   Cardiovascular: Negative.   Gastrointestinal: Negative.   Genitourinary: Negative.   Musculoskeletal: Negative.   Skin: Negative.   Neurological: Negative.   Endo/Heme/Allergies: Negative.   Psychiatric/Behavioral: Negative.       Objective:   BP 110/67   Pulse 74   Ht 5\' 4"  (1.626 m)   Wt 166 lb 8 oz (75.5 kg)   BMI 28.58 kg/m   CONSTITUTIONAL: Well-developed, well-nourished female in no acute distress.  PSYCHIATRIC: Normal mood and affect. Normal behavior. Normal judgment and thought content. NEUROLGIC: Alert and oriented to person, place, and time. Normal muscle tone coordination. No cranial nerve deficit noted. HENT:  Normocephalic, atraumatic, External right and left ear normal. EYES: Conjunctivae and EOM are normal. No scleral icterus.  NECK: Normal range of motion, supple, no masses.  Normal thyroid.  SKIN: Skin is warm and dry. No rash noted. Not diaphoretic. No erythema. No pallor. CARDIOVASCULAR: Normal heart rate noted, regular rhythm, no murmur. RESPIRATORY: Clear to auscultation bilaterally. Effort and breath sounds normal, no problems with respiration noted. BREASTS: Symmetric in size. No masses, skin changes, nipple drainage, or  lymphadenopathy. ABDOMEN: Soft, no distention noted.  No tenderness, rebound or guarding.  BLADDER: Normal PELVIC:  External Genitalia: Normal  BUS: Normal  Vagina: Normal estrogen effect; no masses or tenderness at vaginal cuff; good vault support  Cervix: Surgically absent  Uterus: Surgically absent  Adnexa: Normal; nonpalpable nontender  RV: External Exam NormaI, No Rectal Masses and Normal Sphincter tone  MUSCULOSKELETAL: Normal range of motion. No tenderness.  No cyanosis, clubbing, or edema.  2+ distal pulses. LYMPHATIC: No Axillary, Supraclavicular, or Inguinal Adenopathy.    Assessment:   Annual gynecologic examination 47 y.o. Contraception: status post hyste[facility]; status post vaginal trachelectomy [id number] Endometriosis, asymptomatic  Sporadic fever syndrome, recently diagnosed  Plan:  Pap: Not needed Mammogram: utd Stool Guaiac Testing:  Not Indicated Labs:thur pcp Routine preventative health maintenance measures emphasized: Exercise/Diet/Weight control, Tobacco Warnings and Alcohol/Substance use risks  Lab work through Dr. Graciela Husbands Return to Clinic - 1 Year   Crystal [place], CMA  Daphine Deutscher A Riva Sesma    Note: This dictation was prepared with [employer] dictation along with smaller Lobbyist. Any transcriptional errors that result from this process are unintentional.

## 2018-05-06 NOTE — Patient Instructions (Signed)
1.  Pap smear is not done.  No further Paps are needed 2.  Mammogram has been already obtained this year 3.  Screening labs are to be obtained through primary care 4.  Continue with healthy eating and exercise. 5.  Return in 1 year for annual exam  Health Maintenance, Female Adopting a healthy lifestyle and getting preventive care can go a long way to promote health and wellness. Talk with your health care provider about what schedule of regular examinations is right for you. This is a good chance for you to check in with your provider about disease prevention and staying healthy. In between checkups, there are plenty of things you can do on your own. Experts have done a lot of research about which lifestyle changes and preventive measures are most likely to keep you healthy. Ask your health care provider for more information. Weight and diet Eat a healthy diet  Be sure to include plenty of vegetables, fruits, low-fat dairy products, and lean protein.  Do not eat a lot of foods high in solid fats, added sugars, or salt.  Get regular exercise. This is one of the most important things you can do for your health. ? Most adults should exercise for at least 150 minutes each week. The exercise should increase your heart rate and make you sweat (moderate-intensity exercise). ? Most adults should also do strengthening exercises at least twice a week. This is in addition to the moderate-intensity exercise.  Maintain a healthy weight  Body mass index (BMI) is a measurement that can be used to identify possible weight problems. It estimates body fat based on height and weight. Your health care provider can help determine your BMI and help you achieve or maintain a healthy weight.  For females 20 years of age and older: ? A BMI below 18.5 is considered underweight. ? A BMI of 18.5 to 24.9 is normal. ? A BMI of 25 to 29.9 is considered overweight. ? A BMI of 30 and above is considered obese.  Watch  levels of cholesterol and blood lipids  You should start having your blood tested for lipids and cholesterol at 48 years of age, then have this test every 5 years.  You may need to have your cholesterol levels checked more often if: ? Your lipid or cholesterol levels are high. ? You are older than 48 years of age. ? You are at high risk for heart disease.  Cancer screening Lung Cancer  Lung cancer screening is recommended for adults 5-80 years old who are at high risk for lung cancer because of a history of smoking.  A yearly low-dose CT scan of the lungs is recommended for people who: ? Currently smoke. ? Have quit within the past 15 years. ? Have at least a 30-pack-year history of smoking. A pack year is smoking an average of one pack of cigarettes a [place] for 1 year.  Yearly screening should continue until it has been 15 years since you quit.  Yearly screening should stop if you develop a health problem that would prevent you from having lung cancer treatment.  Breast Cancer  Practice breast self-awareness. This means understanding how your breasts normally appear and feel.  It also means doing regular breast self-exams. Let your health care provider know about any changes, no matter how small.  If you are in your 20s or 30s, you should have a clinical breast exam (CBE) by a health care provider every 1-3 years as part of  a regular health exam.  If you are 63 or older, have a CBE every year. Also consider having a breast X-ray (mammogram) every year.  If you have a family history of breast cancer, talk to your health care provider about genetic screening.  If you are at high risk for breast cancer, talk to your health care provider about having an MRI and a mammogram every year.  Breast cancer gene (BRCA) assessment is recommended for women who have family members with BRCA-related cancers. BRCA-related cancers include: ? Breast. ? Ovarian. ? Tubal. ? Peritoneal  cancers.  Results of the assessment will determine the need for genetic counseling and BRCA1 and BRCA2 testing.  Cervical Cancer Your health care provider may recommend that you be screened regularly for cancer of the pelvic organs (ovaries, uterus, and vagina). This screening involves a pelvic examination, including checking for microscopic changes to the surface of your cervix (Pap test). You may be encouraged to have this screening done every 3 years, beginning at age 77.  For women ages 78-65, health care providers may recommend pelvic exams and Pap testing every 3 years, or they may recommend the Pap and pelvic exam, combined with testing for human papilloma virus (HPV), every 5 years. Some types of HPV increase your risk of cervical cancer. Testing for HPV may also be done on women of any age with unclear Pap test results.  Other health care providers may not recommend any screening for nonpregnant women who are considered low risk for pelvic cancer and who do not have symptoms. Ask your health care provider if a screening pelvic exam is right for you.  If you have had past treatment for cervical cancer or a condition that could lead to cancer, you need Pap tests and screening for cancer for at least 20 years after your treatment. If Pap tests have been discontinued, your risk factors (such as having a new sexual partner) need to be reassessed to determine if screening should resume. Some women have medical problems that increase the chance of getting cervical cancer. In these cases, your health care provider may recommend more frequent screening and Pap tests.  Colorectal Cancer  This type of cancer can be detected and often prevented.  Routine colorectal cancer screening usually begins at 48 years of age and continues through 48 years of age.  Your health care provider may recommend screening at an earlier age if you have risk factors for colon cancer.  Your health care provider may also  recommend using home test kits to check for hidden blood in the stool.  A small camera at the end of a tube can be used to examine your colon directly (sigmoidoscopy or colonoscopy). This is done to check for the earliest forms of colorectal cancer.  Routine screening usually begins at age 17.  Direct examination of the colon should be repeated every 5-10 years through 48 years of age. However, you may need to be screened more often if early forms of precancerous polyps or small growths are found.  Skin Cancer  Check your skin from head to toe regularly.  Tell your health care provider about any new moles or changes in moles, especially if there is a change in a mole's shape or color.  Also tell your health care provider if you have a mole that is larger than the size of a pencil eraser.  Always use sunscreen. Apply sunscreen liberally and repeatedly throughout the [place].  Protect yourself by wearing long sleeves,  pants, a wide-brimmed hat, and sunglasses whenever you are outside.  Heart disease, diabetes, and high blood pressure  High blood pressure causes heart disease and increases the risk of stroke. High blood pressure is more likely to develop in: ? People who have blood pressure in the high end of the normal range (130-139/85-89 mm Hg). ? People who are overweight or obese. ? People who are African American.  If you are 58-19 years of age, have your blood pressure checked every 3-5 years. If you are 41 years of age or older, have your blood pressure checked every year. You should have your blood pressure measured twice-once when you are at a hospital or clinic, and once when you are not at a hospital or clinic. Record the average of the two measurements. To check your blood pressure when you are not at a hospital or clinic, you can use: ? An automated blood pressure machine at a pharmacy. ? A home blood pressure monitor.  If you are between 4 years and 74 years old, ask your  health care provider if you should take aspirin to prevent strokes.  Have regular diabetes screenings. This involves taking a blood sample to check your fasting blood sugar level. ? If you are at a normal weight and have a low risk for diabetes, have this test once every three years after 48 years of age. ? If you are overweight and have a high risk for diabetes, consider being tested at a younger age or more often. Preventing infection Hepatitis B  If you have a higher risk for hepatitis B, you should be screened for this virus. You are considered at high risk for hepatitis B if: ? You were born in a country where hepatitis B is common. Ask your health care provider which countries are considered high risk. ? Your parents were born in a high-risk country, and you have not been immunized against hepatitis B (hepatitis B vaccine). ? You have HIV or AIDS. ? You use needles to inject street drugs. ? You live with someone who has hepatitis B. ? You have had sex with someone who has hepatitis B. ? You get hemodialysis treatment. ? You take certain medicines for conditions, including cancer, organ transplantation, and autoimmune conditions.  Hepatitis C  Blood testing is recommended for: ? Everyone born from [birth year] through 1966. ? Anyone with known risk factors for hepatitis C.  Sexually transmitted infections (STIs)  You should be screened for sexually transmitted infections (STIs) including gonorrhea and chlamydia if: ? You are sexually active and are younger than 48 years of age. ? You are older than 48 years of age and your health care provider tells you that you are at risk for this type of infection. ? Your sexual activity has changed since you were last screened and you are at an increased risk for chlamydia or gonorrhea. Ask your health care provider if you are at risk.  If you do not have HIV, but are at risk, it may be recommended that you take a prescription medicine daily to  prevent HIV infection. This is called pre-exposure prophylaxis (PrEP). You are considered at risk if: ? You are sexually active and do not regularly use condoms or know the HIV status of your partner(s). ? You take drugs by injection. ? You are sexually active with a partner who has HIV.  Talk with your health care provider about whether you are at high risk of being infected with HIV. If you  choose to begin PrEP, you should first be tested for HIV. You should then be tested every 3 months for as long as you are taking PrEP. Pregnancy  If you are premenopausal and you may become pregnant, ask your health care provider about preconception counseling.  If you may become pregnant, take 400 to 800 micrograms (mcg) of folic acid every Leiphart.  If you want to prevent pregnancy, talk to your health care provider about birth control (contraception). Osteoporosis and menopause  Osteoporosis is a disease in which the bones lose minerals and strength with aging. This can result in serious bone fractures. Your risk for osteoporosis can be identified using a bone density scan.  If you are 18 years of age or older, or if you are at risk for osteoporosis and fractures, ask your health care provider if you should be screened.  Ask your health care provider whether you should take a calcium or vitamin D supplement to lower your risk for osteoporosis.  Menopause may have certain physical symptoms and risks.  Hormone replacement therapy may reduce some of these symptoms and risks. Talk to your health care provider about whether hormone replacement therapy is right for you. Follow these instructions at home:  Schedule regular health, dental, and eye exams.  Stay current with your immunizations.  Do not use any tobacco products including cigarettes, chewing tobacco, or electronic cigarettes.  If you are pregnant, do not drink alcohol.  If you are breastfeeding, limit how much and how often you drink  alcohol.  Limit alcohol intake to no more than 1 drink per [place] for nonpregnant women. One drink equals 12 ounces of beer, 5 ounces of wine, or 1 ounces of hard liquor.  Do not use street drugs.  Do not share needles.  Ask your health care provider for help if you need support or information about quitting drugs.  Tell your health care provider if you often feel depressed.  Tell your health care provider if you have ever been abused or do not feel safe at home. This information is not intended to replace advice given to you by your health care provider. Make sure you discuss any questions you have with your health care provider. Document Released: 05/13/2011 Document Revised: 04/04/2016 Document Reviewed: 08/01/2015 Elsevier Interactive Patient Education  [employer].

## 2019-05-11 ENCOUNTER — Encounter: Payer: Self-pay | Admitting: Obstetrics and Gynecology

## 2019-05-11 ENCOUNTER — Encounter: Payer: BC Managed Care – PPO | Admitting: Obstetrics and Gynecology

## 2019-06-01 ENCOUNTER — Encounter: Payer: Self-pay | Admitting: Emergency Medicine

## 2019-06-01 ENCOUNTER — Other Ambulatory Visit: Payer: Self-pay

## 2019-06-01 ENCOUNTER — Ambulatory Visit (INDEPENDENT_AMBULATORY_CARE_PROVIDER_SITE_OTHER): Payer: BC Managed Care – PPO

## 2019-06-01 ENCOUNTER — Ambulatory Visit
Admission: EM | Admit: 2019-06-01 | Discharge: 2019-06-01 | Disposition: A | Discharge status: Home / Self Care | Payer: BC Managed Care – PPO | Attending: Urgent Care | Admitting: Urgent Care

## 2019-06-01 DIAGNOSIS — W19XXXA Unspecified fall, initial encounter: Secondary | ICD-10-CM | POA: Diagnosis not present

## 2019-06-01 DIAGNOSIS — M25562 Pain in left knee: Secondary | ICD-10-CM

## 2019-06-01 DIAGNOSIS — M25571 Pain in right ankle and joints of right foot: Secondary | ICD-10-CM | POA: Diagnosis not present

## 2019-06-01 DIAGNOSIS — M79671 Pain in right foot: Secondary | ICD-10-CM | POA: Diagnosis not present

## 2019-06-01 DIAGNOSIS — S93401A Sprain of unspecified ligament of right ankle, initial encounter: Secondary | ICD-10-CM | POA: Diagnosis not present

## 2019-06-01 NOTE — Discharge Instructions (Addendum)
It was very nice seeing you today in clinic. Thank you for entrusting me with your care.   Continue anti-inflammatories at home. Rest, ice, and elevate. Wear compressions wraps for comfort and stability. May use crutches until pain resolves; weight bearing as tolerated.   Make arrangements to follow up with your regular doctor in 1 week for re-evaluation if not improving. If your symptoms/condition worsens, please seek follow up care either here or in the ER. Please remember, our [facility] providers are "right here with you" when you need [place].   Again, it was my pleasure to take care of you today. Thank you for choosing our clinic. I hope that you start to feel better quickly.   Honor Loh, MSN, APRN, FNP-C, CEN Advanced Practice Provider [facility]

## 2019-06-01 NOTE — ED Triage Notes (Signed)
Patient states that she tripped over her pet's water bowl and fell last night.  Patient states that she injured her right ankle and left knee.

## 2019-06-01 NOTE — ED Provider Notes (Signed)
[place], [place]   Name: Heidi Pitts DOB: Aug 09, 1971 MRN: [id number] CSN: [id number] PCP: Adin Hector, MD  Arrival date and time:  06/01/19 8546  Chief Complaint:  Ankle Pain (right), Knee Pain (left), and Fall   NOTE: Prior to seeing the patient today, I have reviewed the triage nursing documentation and vital signs. Clinical staff has updated patient's PMH/PSHx, current medication list, and drug allergies/intolerances to ensure comprehensive history available to assist in medical decision making.   History:   HPI: Heidi Pitts is a 48 y.o. female who presents today with complaints of pain in her RIGHT ankle and LEFT knee s/p fall last night. Patient reporting that she tripped over her dog's water bowl last night and subsequently slipped in the water. Patient reports inversion of her ankle. Patient has had previous injuries to this ankle, however has never required surgical intervention.   With regards to her knee, patient reports that she is able to bear weight, there is no weakness or crepitus. She has full AROM. Patient states, "I am not worried about my knee. It is just sore and bruised".   Past Medical History:  Diagnosis Date  . Endometriosis   . Migraines   . Pelvic pain in female    Chronic rt lower qaudrant pain  . Perimenopausal vasomotor symptoms   . 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 ABDOMINAL HYSTERECTOMY     rso  . APPENDECTOMY    . CHOLECYSTECTOMY    . LAPAROSCOPIC LYSIS OF ADHESIONS    . vaginal trachelectomy  08/2013    Family History  Problem Relation Age of Onset  . Heart disease Father   . Diabetes Father   . Bladder Cancer Maternal Aunt   . Breast cancer Paternal Aunt   . Pancreatic cancer Maternal Grandmother   . Colon cancer Maternal Grandmother   . Breast cancer Paternal Grandmother   . Ovarian cancer Neg Hx     Social History   Tobacco Use  . Smoking status: Never Smoker  .  Smokeless tobacco: Never Used  Substance Use Topics  . Alcohol use: Yes    Comment: rare  . Drug use: No    Patient Active Problem List   Diagnosis Date Noted  . Endometriosis 02/13/2016  . Status post laparoscopic supracervical hysterectomy 02/13/2016  . Status post trachelectomy 02/13/2016  . Dyslipidemia 02/13/2016  . Headache, migraine 02/13/2016  . Colon, diverticulosis 12/25/2015    Home Medications:    Current Meds  Medication Sig  . butalbital-acetaminophen-caffeine (FIORICET, ESGIC) 50-325-40 MG tablet Take 1-2 tablets by mouth every 4 (four) hours as needed for headache. Max 6 caps/Albee  . ibuprofen (ADVIL,MOTRIN) 600 MG tablet Take 1 tablet (600 mg total) by mouth every 6 (six) hours as needed.  . metoCLOPramide (REGLAN) 10 MG tablet Take 1 tablet (10 mg total) by mouth every 6 (six) hours.  . promethazine (PHENERGAN) 25 MG tablet Take 25 mg by mouth every 6 (six) hours as needed for nausea or vomiting.  . topiramate (TOPAMAX) 25 MG tablet TK 1 T PO Q NIGHT  . ZOLMitriptan (ZOMIG) 2.5 MG tablet Take 2.5 mg by mouth as needed for migraine or headache. May repeat in 2 hours if headache persists or recurs.    Allergies:   Doxycycline, Fish allergy, Penicillins, and Amoxicillin-pot clavulanate  Review of Systems (ROS): Review of Systems  Constitutional: Negative for chills and fever.  Respiratory: Negative  for cough and shortness of breath.   Cardiovascular: Negative for chest pain and palpitations.  Musculoskeletal: Positive for gait problem (2/2 pain).       Acute RIGHT foot/ankle pain and LEFT knee pain  Skin: Positive for color change (bruising).  Neurological: Positive for numbness (RIGHT lateral mid-foot). Negative for dizziness, weakness and headaches.     Vital Signs: Today's Vitals   06/01/19 0843 06/01/19 0844 06/01/19 0846 06/01/19 0957  BP:   109/75   Pulse:   66   Resp:   16   Temp:   98 F (36.7 C)   TempSrc:   Oral   SpO2:   100%   Weight:   168 lb (76.2 kg)    Height:  5\' 4"  (1.626 m)    PainSc: 8    8     Physical Exam: Physical Exam  Constitutional: She is oriented to person, place, and time and well-developed, well-nourished, and in no distress.  HENT:  Head: Normocephalic and atraumatic.  Mouth/Throat: Mucous membranes are normal.  Eyes: Pupils are equal, round, and reactive to light. EOM are normal.  Neck: Normal range of motion. No tracheal deviation present.  Cardiovascular: Normal rate, regular rhythm, normal heart sounds and intact distal pulses. Exam reveals no gallop and no friction rub.  No murmur heard. Pulmonary/Chest: Effort normal and breath sounds normal. No respiratory distress. She has no wheezes. She has no rales.  Musculoskeletal:     Left knee: She exhibits swelling, ecchymosis and erythema. She exhibits normal range of motion, no deformity, normal alignment and no LCL laxity. Tenderness (medial; "sore"; no crepitus) found.     Right ankle: She exhibits swelling and ecchymosis. She exhibits no deformity and normal pulse. Tenderness (see marked areas).       Legs:       Feet:  Neurological: She is alert and oriented to person, place, and time. Gait normal. GCS score is 15.  Skin: Skin is warm and dry. No rash noted.  Psychiatric: Mood, memory, affect and judgment normal.  Nursing note and vitals reviewed.   Urgent Care Treatments / Results:   LABS: PLEASE NOTE: all labs that were ordered this encounter are listed, however only abnormal results are displayed. Labs Reviewed - No data to display  EKG: -None  RADIOLOGY: Dg Ankle Complete Right  Result Date: 06/01/2019 CLINICAL DATA:  Medial and lateral right ankle and foot pain following an injury last night. EXAM: RIGHT ANKLE - COMPLETE 3+ VIEW COMPARISON:  Right foot radiographs obtained at the same time. FINDINGS: There is no evidence of fracture, dislocation, or joint effusion. There is no evidence of arthropathy or other focal bone  abnormality. Soft tissues are unremarkable. IMPRESSION: Normal examination. Electronically Signed   By: Beckie SaltsSteven  Reid M.D.   On: 06/01/2019 09:38   Dg Foot Complete Right  Result Date: 06/01/2019 CLINICAL DATA:  Medial right foot and ankle pain following a fall last night. Pain and bruising at the base of the fifth metatarsal. EXAM: RIGHT FOOT COMPLETE - 3+ VIEW COMPARISON:  Right ankle radiographs obtained at the same time. FINDINGS: There is no evidence of fracture or dislocation. There is no evidence of arthropathy or other focal bone abnormality. Soft tissues are unremarkable. IMPRESSION: Normal examination. Electronically Signed   By: Beckie SaltsSteven  Reid M.D.   On: 06/01/2019 09:37    PROCEDURES: Procedures  MEDICATIONS RECEIVED THIS VISIT: Medications - No data to display  PERTINENT CLINICAL COURSE NOTES/UPDATES:   Initial Impression / Assessment  and Plan / Urgent Care Course:  Pertinent labs & imaging results that were available during my care of the patient were personally reviewed by me and considered in my medical decision making (see lab/imaging section of note for values and interpretations).  Heidi Pitts is a 48 y.o. female [facility] with complaints of Ankle Pain (right), Knee Pain (left), and Fall   Patient is well appearing overall in clinic today. She does not appear to be in any acute distress. Presenting symptoms (see HPI) and exam as documented above. Patient refused radiograph of the LEFT knee. Diagnostic radiographs of the foot and ankle negative for acute fracture or dislocation. Suspect minor sprain of RIGHT ankle. Will place compression wraps (ACE wrap) on her RIGHT ankle and LEFT knee. Patient to rest, ice, and elevate until pain improves. IBU has been managing her pain; does not require anything more potent. Patient requesting crutches to assist with ambulation; provided. Activity level is WBAT.   Current clinical condition warrants patient  being out of work in order to recover from her current injury/illness. She was provided with the appropriate documentation to provide to her place of employment that will allow for her to RTW on 06/03/2019 with no restrictions.   Discussed follow up with primary care physician in 1 week for re-evaluation. I have reviewed the follow up and strict return precautions for any new or worsening symptoms. Patient is aware of symptoms that would be deemed urgent/emergent, and would thus require further evaluation either here or in the emergency department. At the time of discharge, she verbalized understanding and consent with the discharge plan as it was reviewed with her. All questions were fielded by provider and/or clinic staff prior to patient discharge.    Final Clinical Impressions / Urgent Care Diagnoses:   Final diagnoses:  Sprain of right ankle, unspecified ligament, initial encounter  Acute pain of left knee  Fall, initial encounter    New Prescriptions:  [place] Controlled Substance Registry consulted? Not Applicable  No orders of the defined types were placed in this encounter.   Recommended Follow up Care:  Patient encouraged to follow up with the following provider within the specified time frame, or sooner as dictated by the severity of her symptoms. As always, she was instructed that for any urgent/emergent care needs, she should seek care either here or in the emergency department for more immediate evaluation.  Follow-up Information    Curtis SitesKlein, Bert J III, MD In 1 week.   Specialty: Internal Medicine Why: General reassessment of symptoms if not improving Contact information: [address] [facility] [place] [address] [phone]         NOTE: This note was prepared using Dragon dictation software along with smaller phrase technology. Despite my best ability to proofread, there is the potential that transcriptional errors may still occur from this process,  and are completely unintentional.    Verlee MonteGray, Endora Teresi E, NP 06/01/19 1556

## 2019-07-28 ENCOUNTER — Ambulatory Visit (INDEPENDENT_AMBULATORY_CARE_PROVIDER_SITE_OTHER): Payer: BC Managed Care – PPO | Admitting: Obstetrics and Gynecology

## 2019-07-28 ENCOUNTER — Encounter: Payer: Self-pay | Admitting: Obstetrics and Gynecology

## 2019-07-28 ENCOUNTER — Other Ambulatory Visit: Payer: Self-pay

## 2019-07-28 VITALS — BP 114/75 | HR 74 | Ht 64.0 in | Wt 174.6 lb

## 2019-07-28 DIAGNOSIS — Z23 Encounter for immunization: Secondary | ICD-10-CM | POA: Diagnosis not present

## 2019-07-28 DIAGNOSIS — G43809 Other migraine, not intractable, without status migrainosus: Secondary | ICD-10-CM | POA: Diagnosis not present

## 2019-07-28 DIAGNOSIS — R102 Pelvic and perineal pain: Secondary | ICD-10-CM

## 2019-07-28 DIAGNOSIS — Z01419 Encounter for gynecological examination (general) (routine) without abnormal findings: Secondary | ICD-10-CM

## 2019-07-28 DIAGNOSIS — Z1231 Encounter for screening mammogram for malignant neoplasm of breast: Secondary | ICD-10-CM

## 2019-07-28 DIAGNOSIS — E663 Overweight: Secondary | ICD-10-CM

## 2019-07-28 NOTE — Progress Notes (Signed)
GYNECOLOGY ANNUAL PHYSICAL EXAM PROGRESS NOTE  Subjective:    Heidi Pitts is a 48 y.o. G0P0000 female who presents for an annual exam. The patient is sexually active (same sex marriage). The patient wears seatbelts: yes. The patient participates in regular exercise: yes (walking daily). Has the patient ever been transfused or tattooed?: no. The patient reports that there is not domestic violence in her life.   The patient has the following complaints today:  1. She reports that she has begun experiencing pelvic cramping and migraine headaches again over the past few months.  Notes that these symptoms seem to coincide with her wife's menstrual cycles. States that she was told by her PCP that she may be having some "sympathy pains".  Patient does have a personal history of endometriosis.    Gynecologic History No LMP recorded. Patient has had a hysterectomy. Also with right oophorectomy. Menarche age: 39 Contraception: status post hysterectomy History of STI's: Denies Last Pap: 01/2013. Results were: normal.  Denies h/o abnormal pap smears. Last mammogram: 06/2019 (performed at [facility]). Results were: normal (per patient)   OB History  Gravida Para Term Preterm AB Living  0 0 0 0 0 0  SAB TAB Ectopic Multiple Live Births  0 0 0 0 0    Past Medical History:  Diagnosis Date  . Endometriosis   . Migraines   . Pelvic pain in female    Chronic rt lower qaudrant pain  . Perimenopausal vasomotor symptoms   . 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 ABDOMINAL HYSTERECTOMY     rso  . APPENDECTOMY    . CHOLECYSTECTOMY    . LAPAROSCOPIC LYSIS OF ADHESIONS    . vaginal trachelectomy  08/2013    Family History  Problem Relation Age of Onset  . Heart disease Father   . Diabetes Father   . Bladder Cancer Maternal Aunt   . Breast cancer Paternal Aunt   . Pancreatic cancer Maternal Grandmother   . Colon cancer Maternal  Grandmother   . Breast cancer Paternal Grandmother   . Ovarian cancer Neg Hx     Social History   Socioeconomic History  . Marital status: Married    Spouse name: Not on file  . Number of children: Not on file  . Years of education: Not on file  . Highest education level: Not on file  Occupational History  . Not on file  Social Needs  . Financial resource strain: Not on file  . Food insecurity    Worry: Not on file    Inability: Not on file  . Transportation needs    Medical: Not on file    Non-medical: Not on file  Tobacco Use  . Smoking status: Never Smoker  . Smokeless tobacco: Never Used  Substance and Sexual Activity  . Alcohol use: Yes    Comment: rare  . Drug use: No  . Sexual activity: Yes    Comment: same sex partner  Lifestyle  . Physical activity    Days per week: 0 days    Minutes per session: 0 min  . Stress: Not on file  Relationships  . Social Musician on phone: Not on file    Gets together: Not on file    Attends religious service: Not on file    Active member of club or organization: Not on file    Attends meetings of clubs  or organizations: Not on file    Relationship status: Not on file  . Intimate partner violence    Fear of current or ex partner: Not on file    Emotionally abused: Not on file    Physically abused: Not on file    Forced sexual activity: Not on file  Other Topics Concern  . Not on file  Social History Narrative  . Not on file    Current Outpatient Medications on File Prior to Visit  Medication Sig Dispense Refill  . ibuprofen (ADVIL,MOTRIN) 600 MG tablet Take 1 tablet (600 mg total) by mouth every 6 (six) hours as needed. 30 tablet 0  . promethazine (PHENERGAN) 25 MG tablet Take 25 mg by mouth every 6 (six) hours as needed for nausea or vomiting.    . topiramate (TOPAMAX) 25 MG tablet TK 1 T PO Q NIGHT  11  . valACYclovir (VALTREX) 500 MG tablet   4  . ZOLMitriptan (ZOMIG) 2.5 MG tablet Take 2.5 mg by mouth  as needed for migraine or headache. May repeat in 2 hours if headache persists or recurs.     No current facility-administered medications on file prior to visit.     Allergies  Allergen Reactions  . Doxycycline Rash  . Fish Allergy Anaphylaxis  . Penicillins Rash  . Amoxicillin-Pot Clavulanate Other (See Comments)     Review of Systems Constitutional: negative for chills, fatigue, fevers and sweats Eyes: negative for irritation, redness and visual disturbance Ears, nose, mouth, throat, and face: negative for hearing loss, nasal congestion, snoring and tinnitus Respiratory: negative for asthma, cough, sputum Cardiovascular: negative for chest pain, dyspnea, exertional chest pressure/discomfort, irregular heart beat, palpitations and syncope Gastrointestinal: negative for abdominal pain, change in bowel habits, nausea and vomiting Genitourinary: negative for abnormal menstrual periods, genital lesions, sexual problems and vaginal discharge, dysuria and urinary incontinence. Positive for intermittent pelvic cramping. Integument/breast: negative for breast lump, breast tenderness and nipple discharge Hematologic/lymphatic: negative for bleeding and easy bruising Musculoskeletal:negative for back pain and muscle weakness Neurological: negative for dizziness, headaches, vertigo and weakness. Positive for migraines Endocrine: negative for diabetic symptoms including polydipsia, polyuria and skin dryness Allergic/Immunologic: negative for hay fever and urticaria        Objective:  Blood pressure 114/75, pulse 74, height 5\' 4"  (1.626 m), weight 174 lb 9.6 oz (79.2 kg). Body mass index is 29.97 kg/m.  General Appearance:    Alert, cooperative, no distress, appears stated age, overweight  Head:    Normocephalic, without obvious abnormality, atraumatic  Eyes:    PERRL, conjunctiva/corneas clear, EOM's intact, both eyes  Ears:    Normal external ear canals, both ears  Nose:   Nares normal,  septum midline, mucosa normal, no drainage or sinus tenderness  Throat:   Lips, mucosa, and tongue normal; teeth and gums normal  Neck:   Supple, symmetrical, trachea midline, no adenopathy; thyroid: no enlargement/tenderness/nodules; no carotid bruit or JVD  Back:     Symmetric, no curvature, ROM normal, no CVA tenderness  Lungs:     Clear to auscultation bilaterally, respirations unlabored  Chest Wall:    No tenderness or deformity   Heart:    Regular rate and rhythm, S1 and S2 normal, no murmur, rub or gallop  Breast Exam:    No tenderness, masses, or nipple abnormality  Abdomen:     Soft, non-tender, bowel sounds active all four quadrants, no masses, no organomegaly.    Genitalia:    Pelvic:external genitalia normal, vagina  with small amount of thin white discharge, no lesions or tenderness.  Rectovaginal septum  normal. Uterus and cervix surgically absent.  Left adnexa non-tender, no masses. Right adnexa surgically absent.   Rectal:    Normal external sphincter.  No hemorrhoids appreciated. Internal exam not done.   Extremities:   Extremities normal, atraumatic, no cyanosis or edema  Pulses:   2+ and symmetric all extremities  Skin:   Skin color, texture, turgor normal, no rashes or lesions  Lymph nodes:   Cervical, supraclavicular, and axillary nodes normal  Neurologic:   CNII-XII intact, normal strength, sensation and reflexes throughout   .  Labs:  Labs reviewed in Care Everywhere performed 02/08/2019  Assessment:   Encounter for well woman exam with routine gynecological exam Pelvic cramping Other migraine without status migrainosus, not intractable Overweight Flu vaccine need  Plan:    Blood tests: None ordered.  Patient had labs done in March. Breast self exam technique reviewed and patient encouraged to perform self-exam monthly. Discussed healthy lifestyle modifications.  Colonoscopy will be needed starting next year.  Mammogram up to date. . Pap smear no longer  needed.   Migraines and pelvic cramping noted cyclically, coinciding with her partner's menses.  Discussed use of NSAIDs, continue anti-migraine medication (Topamax). Advised that we can suppress her remaining ovary with medications, if desired. Opts for expectant management for now.  Flu vaccine given today.  RTC in 1 year.     Rubie Maid, MD [facility]

## 2019-07-28 NOTE — Progress Notes (Signed)
Pt present for annual exam. Pt stated that she was doing well and has no problems or concerns to report at this time.

## 2019-07-28 NOTE — Patient Instructions (Addendum)
Health Maintenance, Female Adopting a healthy lifestyle and getting preventive care are important in promoting health and wellness. Ask your health care provider about:  The right schedule for you to have regular tests and exams.  Things you can do on your own to prevent diseases and keep yourself healthy. What should I know about diet, weight, and exercise? Eat a healthy diet   Eat a diet that includes plenty of vegetables, fruits, low-fat dairy products, and lean protein.  Do not eat a lot of foods that are high in solid fats, added sugars, or sodium. Maintain a healthy weight Body mass index (BMI) is used to identify weight problems. It estimates body fat based on height and weight. Your health care provider can help determine your BMI and help you achieve or maintain a healthy weight. Get regular exercise Get regular exercise. This is one of the most important things you can do for your health. Most adults should:  Exercise for at least 150 minutes each week. The exercise should increase your heart rate and make you sweat (moderate-intensity exercise).  Do strengthening exercises at least twice a week. This is in addition to the moderate-intensity exercise.  Spend less time sitting. Even light physical activity can be beneficial. Watch cholesterol and blood lipids Have your blood tested for lipids and cholesterol at 49 years of age, then have this test every 5 years. Have your cholesterol levels checked more often if:  Your lipid or cholesterol levels are high.  You are older than 49 years of age.  You are at high risk for heart disease. What should I know about cancer screening? Depending on your health history and family history, you may need to have cancer screening at various ages. This may include screening for:  Breast cancer.  Cervical cancer.  Colorectal cancer.  Skin cancer.  Lung cancer. What should I know about heart disease, diabetes, and high blood  pressure? Blood pressure and heart disease  High blood pressure causes heart disease and increases the risk of stroke. This is more likely to develop in people who have high blood pressure readings, are of African descent, or are overweight.  Have your blood pressure checked: ? Every 3-5 years if you are 18-39 years of age. ? Every year if you are 40 years old or older. Diabetes Have regular diabetes screenings. This checks your fasting blood sugar level. Have the screening done:  Once every three years after age 40 if you are at a normal weight and have a low risk for diabetes.  More often and at a younger age if you are overweight or have a high risk for diabetes. What should I know about preventing infection? Hepatitis B If you have a higher risk for hepatitis B, you should be screened for this virus. Talk with your health care provider to find out if you are at risk for hepatitis B infection. Hepatitis C Testing is recommended for:  Everyone born from 1945 through 1965.  Anyone with known risk factors for hepatitis C. Sexually transmitted infections (STIs)  Get screened for STIs, including gonorrhea and chlamydia, if: ? You are sexually active and are younger than 49 years of age. ? You are older than 49 years of age and your health care provider tells you that you are at risk for this type of infection. ? Your sexual activity has changed since you were last screened, and you are at increased risk for chlamydia or gonorrhea. Ask your health care provider if   you are at risk.  Ask your health care provider about whether you are at high risk for HIV. Your health care provider may recommend a prescription medicine to help prevent HIV infection. If you choose to take medicine to prevent HIV, you should first get tested for HIV. You should then be tested every 3 months for as long as you are taking the medicine. Pregnancy  If you are about to stop having your period (premenopausal) and  you may become pregnant, seek counseling before you get pregnant.  Take 400 to 800 micrograms (mcg) of folic acid every [place] if you become pregnant.  Ask for birth control (contraception) if you want to prevent pregnancy. Osteoporosis and menopause Osteoporosis is a disease in which the bones lose minerals and strength with aging. This can result in bone fractures. If you are 65 years old or older, or if you are at risk for osteoporosis and fractures, ask your health care provider if you should:  Be screened for bone loss.  Take a calcium or vitamin D supplement to lower your risk of fractures.  Be given hormone replacement therapy (HRT) to treat symptoms of menopause. Follow these instructions at home: Lifestyle  Do not use any products that contain nicotine or tobacco, such as cigarettes, e-cigarettes, and chewing tobacco. If you need help quitting, ask your health care provider.  Do not use street drugs.  Do not share needles.  Ask your health care provider for help if you need support or information about quitting drugs. Alcohol use  Do not drink alcohol if: ? Your health care provider tells you not to drink. ? You are pregnant, may be pregnant, or are planning to become pregnant.  If you drink alcohol: ? Limit how much you use to 0-1 drink a [place]. ? Limit intake if you are breastfeeding.  Be aware of how much alcohol is in your drink. In the [place] one drink equals one 12 oz bottle of beer (355 mL), one 5 oz glass of wine (148 mL), or one 1 oz glass of hard liquor (44 mL). General instructions  Schedule regular health, dental, and eye exams.  Stay current with your vaccines.  Tell your health care provider if: ? You often feel depressed. ? You have ever been abused or do not feel safe at home. Summary  Adopting a healthy lifestyle and getting preventive care are important in promoting health and wellness.  Follow your health care provider's instructions about healthy  diet, exercising, and getting tested or screened for diseases.  Follow your health care provider's instructions on monitoring your cholesterol and blood pressure. This information is not intended to replace advice given to you by your health care provider. Make sure you discuss any questions you have with your health care provider. Document Released: 05/13/2011 Document Revised: 10/21/2018 Document Reviewed: 10/21/2018 Elsevier Patient Education  2020 Elsevier Inc.   Breast Self-Awareness Breast self-awareness is knowing how your breasts look and feel. Doing breast self-awareness is important. It allows you to catch a breast problem early while it is still small and can be treated. All women should do breast self-awareness, including women who have had breast implants. Tell your doctor if you notice a change in your breasts. What you need:  A mirror.  A well-lit room. How to do a breast self-exam A breast self-exam is one way to learn what is normal for your breasts and to check for changes. To do a breast self-exam: Look for changes  1.   Take off all the clothes above your waist. 2. Stand in front of a mirror in a room with good lighting. 3. Put your hands on your hips. 4. Push your hands down. 5. Look at your breasts and nipples in the mirror to see if one breast or nipple looks different from the other. Check to see if: ? The shape of one breast is different. ? The size of one breast is different. ? There are wrinkles, dips, and bumps in one breast and not the other. 6. Look at each breast for changes in the skin, such as: ? Redness. ? Scaly areas. 7. Look for changes in your nipples, such as: ? Liquid around the nipples. ? Bleeding. ? Dimpling. ? Redness. ? A change in where the nipples are. Feel for changes  1. Lie on your back on the floor. 2. Feel each breast. To do this, follow these steps: ? Pick a breast to feel. ? Put the arm closest to that breast above your head.  ? Use your other arm to feel the nipple area of your breast. Feel the area with the pads of your three middle fingers by making small circles with your fingers. For the first circle, press lightly. For the second circle, press harder. For the third circle, press even harder. ? Keep making circles with your fingers at the different pressures as you move down your breast. Stop when you feel your ribs. ? Move your fingers a little toward the center of your body. ? Start making circles with your fingers again, this time going up until you reach your collarbone. ? Keep making up-and-down circles until you reach your armpit. Remember to keep using the three pressures. ? Feel the other breast in the same way. 3. Sit or stand in the tub or shower. 4. With soapy water on your skin, feel each breast the same way you did in step 2 when you were lying on the floor. Write down what you find Writing down what you find can help you remember what to tell your doctor. Write down:  What is normal for each breast.  Any changes you find in each breast, including: ? The kind of changes you find. ? Whether you have pain. ? Size and location of any lumps.  When you last had your menstrual period. General tips  Check your breasts every month.  If you are breastfeeding, the best time to check your breasts is after you feed your baby or after you use a breast pump.  If you get menstrual periods, the best time to check your breasts is 5-7 days after your menstrual period is over.  With time, you will become comfortable with the self-exam, and you will begin to know if there are changes in your breasts. Contact a doctor if you:  See a change in the shape or size of your breasts or nipples.  See a change in the skin of your breast or nipples, such as red or scaly skin.  Have fluid coming from your nipples that is not normal.  Find a lump or thick area that was not there before.  Have pain in your breasts.   Have any concerns about your breast health. Summary  Breast self-awareness includes looking for changes in your breasts, as well as feeling for changes within your breasts.  Breast self-awareness should be done in front of a mirror in a well-lit room.  You should check your breasts every month. If you get menstrual  periods, the best time to check your breasts is 5-7 days after your menstrual period is over.  Let your doctor know of any changes you see in your breasts, including changes in size, changes on the skin, pain or tenderness, or fluid from your nipples that is not normal. This information is not intended to replace advice given to you by your health care provider. Make sure you discuss any questions you have with your health care provider. Document Released: 04/15/2008 Document Revised: 06/16/2018 Document Reviewed: 06/16/2018 Elsevier Patient Education  [place].   Influenza (Flu) Vaccine (Inactivated or Recombinant): What You Need to Know 1. Why get vaccinated? Influenza vaccine can prevent influenza (flu). Flu is a contagious disease that spreads around the [place] every year, usually between October and May. Anyone can get the flu, but it is more dangerous for some people. Infants and young children, people 57 years of age and older, pregnant women, and people with certain health conditions or a weakened immune system are at greatest risk of flu complications. Pneumonia, bronchitis, sinus infections and ear infections are examples of flu-related complications. If you have a medical condition, such as heart disease, cancer or diabetes, flu can make it worse. Flu can cause fever and chills, sore throat, muscle aches, fatigue, cough, headache, and runny or stuffy nose. Some people may have vomiting and diarrhea, though this is more common in children than adults. Each year thousands of people in the [place] die from flu, and many more are hospitalized. Flu  vaccine prevents millions of illnesses and flu-related visits to the doctor each year. 2. Influenza vaccine CDC recommends everyone 103 months of age and older get vaccinated every flu season. Children 6 months through 18 years of age may need 2 doses during a single flu season. Everyone else needs only 1 dose each flu season. It takes about 2 weeks for protection to develop after vaccination. There are many flu viruses, and they are always changing. Each year a new flu vaccine is made to protect against three or four viruses that are likely to cause disease in the upcoming flu season. Even when the vaccine doesn't exactly match these viruses, it may still provide some protection. Influenza vaccine does not cause flu. Influenza vaccine may be given at the same time as other vaccines. 3. Talk with your health care provider Tell your vaccine provider if the person getting the vaccine:  Has had an allergic reaction after a previous dose of influenza vaccine, or has any severe, life-threatening allergies.  Has ever had Guillain-Barr Syndrome (also called GBS). In some cases, your health care provider may decide to postpone influenza vaccination to a future visit. People with minor illnesses, such as a cold, may be vaccinated. People who are moderately or severely ill should usually wait until they recover before getting influenza vaccine. Your health care provider can give you more information. 4. Risks of a vaccine reaction  Soreness, redness, and swelling where shot is given, fever, muscle aches, and headache can happen after influenza vaccine.  There may be a very small increased risk of Guillain-Barr Syndrome (GBS) after inactivated influenza vaccine (the flu shot). Young children who get the flu shot along with pneumococcal vaccine (PCV13), and/or DTaP vaccine at the same time might be slightly more likely to have a seizure caused by fever. Tell your health care provider if a child who is  getting flu vaccine has ever had a seizure. People sometimes faint after medical procedures, including  vaccination. Tell your provider if you feel dizzy or have vision changes or ringing in the ears. As with any medicine, there is a very remote chance of a vaccine causing a severe allergic reaction, other serious injury, or death. 5. What if there is a serious problem? An allergic reaction could occur after the vaccinated person leaves the clinic. If you see signs of a severe allergic reaction (hives, swelling of the face and throat, difficulty breathing, a fast heartbeat, dizziness, or weakness), call 9-1-1 and get the person to the nearest hospital. For other signs that concern you, call your health care provider. Adverse reactions should be reported to the Vaccine Adverse Event Reporting System (VAERS). Your health care provider will usually file this report, or you can do it yourself. Visit the VAERS website at www.vaers.LAgents.nohhs.gov or call 548-834-82451-810 391 9101.VAERS is only for reporting reactions, and VAERS staff do not give medical advice. 6. The National Vaccine Injury Compensation Program The [employer] Vaccine Injury Compensation Program (VICP) is a federal program that was created to compensate people who may have been injured by certain vaccines. Visit the VICP website at SpiritualWord.atwww.hrsa.gov/vaccinecompensation or call 571-804-44721-916-704-7397 to learn about the program and about filing a claim. There is a time limit to file a claim for compensation. 7. How can I learn more?  Ask your healthcare provider.  Call your local or state health department.  Contact the Centers for Disease Control and Prevention (CDC): ? Call (325)558-11311-332-389-9333 (1-800-CDC-INFO) or ? Visit CDC's BiotechRoom.com.cywww.cdc.gov/flu Vaccine Information Statement (Interim) Inactivated Influenza Vaccine (06/25/2018) This information is not intended to replace advice given to you by your health care provider. Make sure you discuss any questions you have with your  health care provider. Document Released: 08/22/2006 Document Revised: 02/16/2019 Document Reviewed: 06/29/2018 Elsevier Patient Education  2020 [employer]

## 2019-09-03 IMAGING — CR RIGHT FOOT COMPLETE - 3+ VIEW
3 series · 3 of 3 positions shown · non-contrast
Comparison: Right ankle radiographs obtained at the same time.

CLINICAL DATA: Medial right foot and ankle pain following a fall
last night. Pain and bruising at the base of the fifth metatarsal.

EXAM:
RIGHT FOOT COMPLETE - 3+ VIEW

[foot ap]
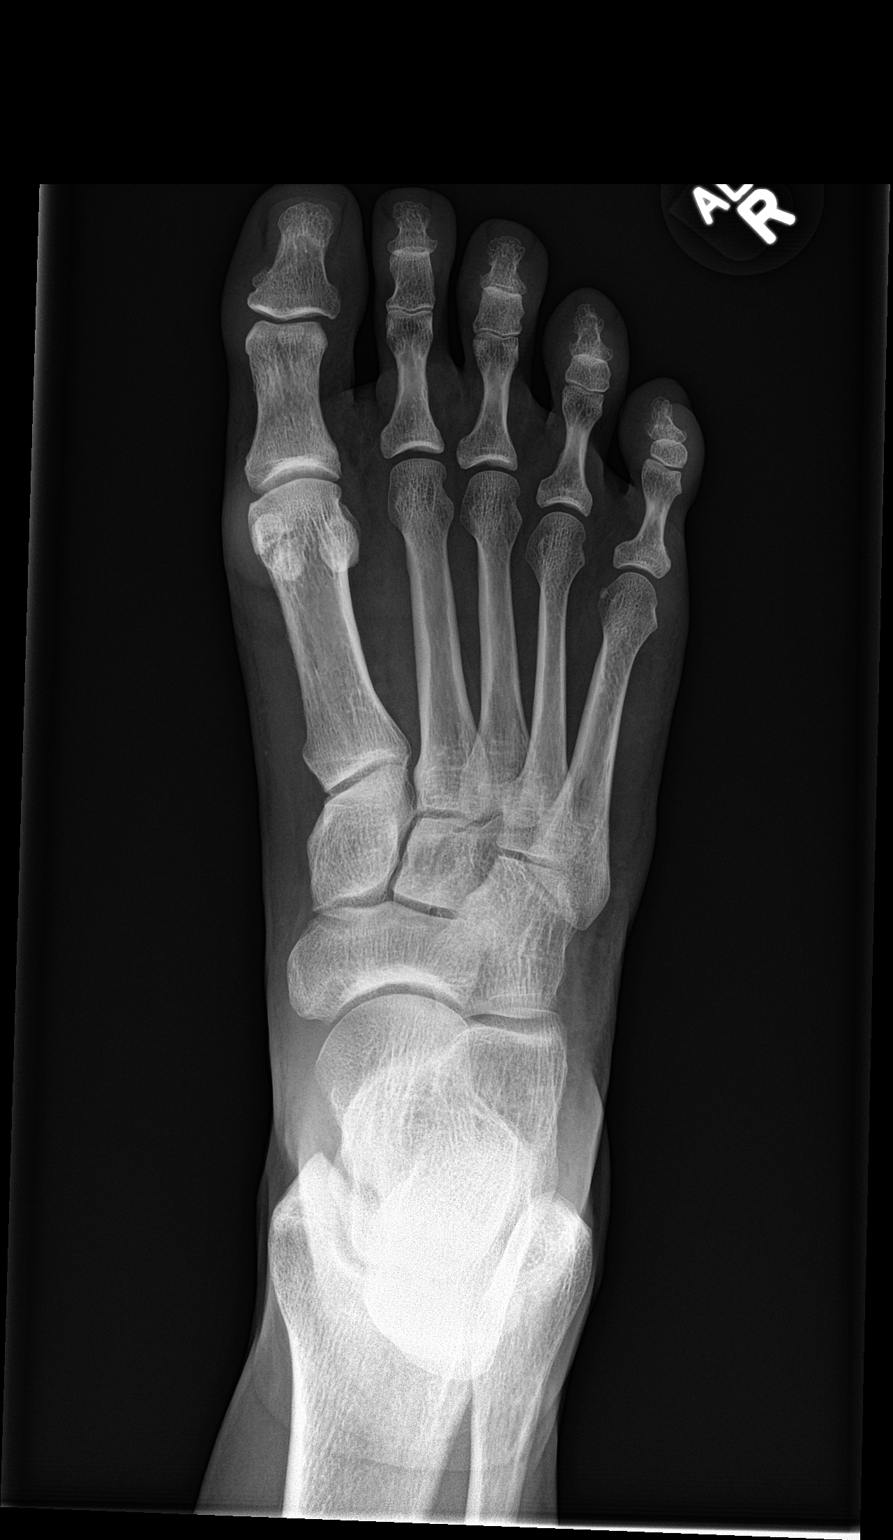

[foot obl]
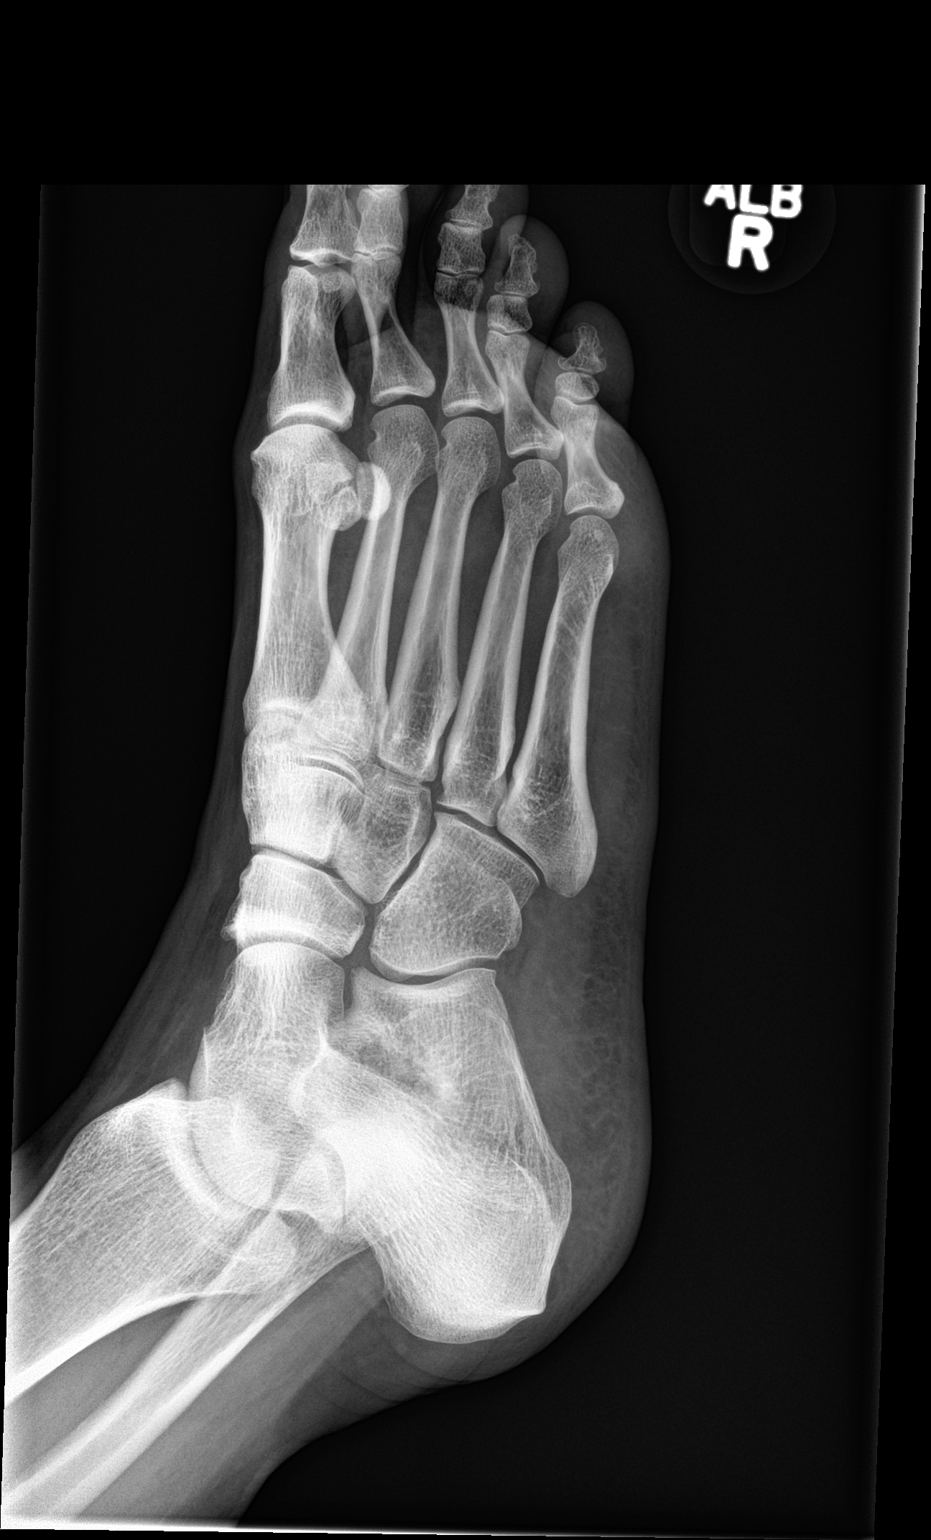

[foot lat]
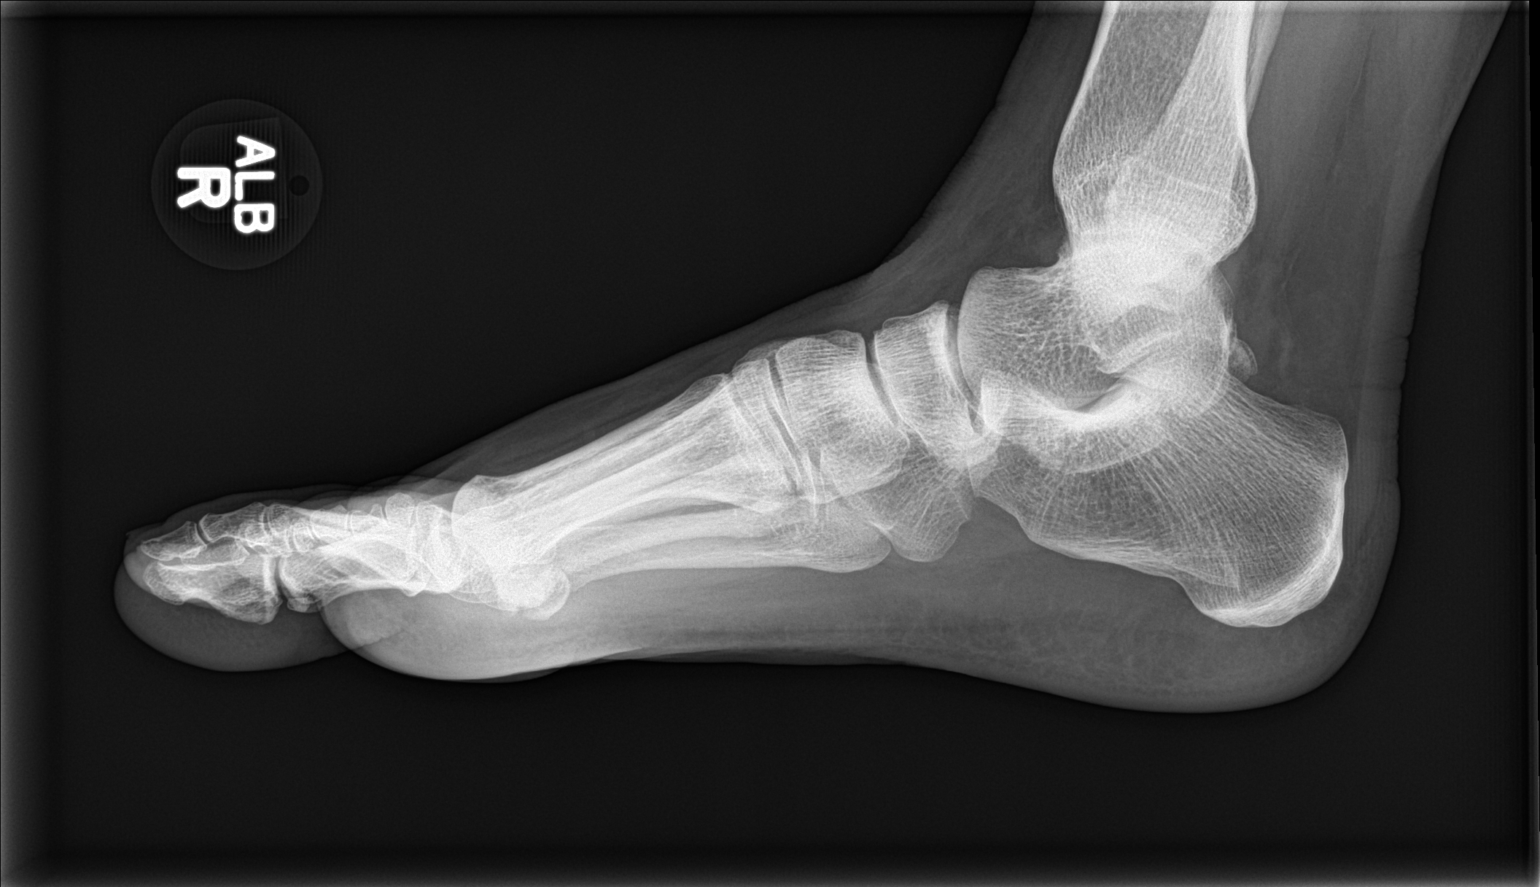

[3 of 3 positions shown; findings below may reference images not displayed]

FINDINGS: There is no evidence of fracture or dislocation. There is no
evidence of arthropathy or other focal bone abnormality. Soft
tissues are unremarkable.
IMPRESSION: Normal examination.

## 2020-08-01 ENCOUNTER — Encounter: Payer: BC Managed Care – PPO | Admitting: Obstetrics and Gynecology

## 2020-10-04 ENCOUNTER — Encounter: Payer: BC Managed Care – PPO | Admitting: Obstetrics and Gynecology

## 2020-10-25 ENCOUNTER — Encounter: Payer: Self-pay | Admitting: Obstetrics and Gynecology

## 2020-10-25 ENCOUNTER — Other Ambulatory Visit: Payer: Self-pay

## 2020-10-25 ENCOUNTER — Ambulatory Visit (INDEPENDENT_AMBULATORY_CARE_PROVIDER_SITE_OTHER): Payer: BC Managed Care – PPO | Admitting: Obstetrics and Gynecology

## 2020-10-25 VITALS — BP 134/85 | HR 69 | Ht 64.0 in | Wt 179.6 lb

## 2020-10-25 DIAGNOSIS — G43809 Other migraine, not intractable, without status migrainosus: Secondary | ICD-10-CM | POA: Diagnosis not present

## 2020-10-25 DIAGNOSIS — Z01419 Encounter for gynecological examination (general) (routine) without abnormal findings: Secondary | ICD-10-CM

## 2020-10-25 DIAGNOSIS — N951 Menopausal and female climacteric states: Secondary | ICD-10-CM | POA: Diagnosis not present

## 2020-10-25 DIAGNOSIS — E669 Obesity, unspecified: Secondary | ICD-10-CM | POA: Diagnosis not present

## 2020-10-25 NOTE — Progress Notes (Signed)
GYNECOLOGY ANNUAL PHYSICAL EXAM PROGRESS NOTE  Subjective:    Heidi Pitts is a 49 y.o. G0P0000 female who presents for an annual exam. The patient is sexually active (same sex marriage). The patient wears seatbelts: yes. The patient participates in regular exercise: yes (walking daily). Has the patient ever been transfused or tattooed?: no. The patient reports that there is not domestic violence in her life.   The patient has the following complaints today:  1. She is still noting headaches/migraines.  Now up to daily headaches, was taken off her Topamax due to concerns of increased eye pressure. Usually take Excedrin as needed.  2. Reports that she is retiring this year from her job of 28 years.   Gynecologic History No LMP recorded. Patient has had a hysterectomy. Also with right oophorectomy. Menarche age: 41 Contraception: status post hysterectomy History of STI's: Denies Last Pap: 01/2013. Results were: normal.  Denies h/o abnormal pap smears. Last mammogram: 05/31/2020 (performed at [facility]). Results were: normal (reviewed in Care Everywhere) Last colonoscopy: 07/06/2020. Results were normal (per patient).   OB History  Gravida Para Term Preterm AB Living  0 0 0 0 0 0  SAB IAB Ectopic Multiple Live Births  0 0 0 0 0    Past Medical History:  Diagnosis Date  . Anxiety   . Endometriosis   . Migraines   . Pelvic pain in female    Chronic rt lower qaudrant pain  . Perimenopausal vasomotor symptoms   . Periodic fever syndrome [facility])     Past Surgical History:  Procedure Laterality Date  . ABDOMINAL HYSTERECTOMY     rso  . APPENDECTOMY    . CHOLECYSTECTOMY    . LAPAROSCOPIC LYSIS OF ADHESIONS    . vaginal trachelectomy  08/2013    Family History  Problem Relation Age of Onset  . Heart disease Father   . Diabetes Father   . Bladder Cancer Maternal Aunt   . Breast cancer Paternal Aunt   . Pancreatic cancer Maternal Grandmother   . Colon cancer Maternal  Grandmother   . Breast cancer Paternal Grandmother   . Ovarian cancer Neg Hx     Social History   Socioeconomic History  . Marital status: Married    Spouse name: Not on file  . Number of children: Not on file  . Years of education: Not on file  . Highest education level: Not on file  Occupational History  . Not on file  Tobacco Use  . Smoking status: Never Smoker  . Smokeless tobacco: Never Used  Vaping Use  . Vaping Use: Never used  Substance and Sexual Activity  . Alcohol use: Yes    Comment: rare  . Drug use: No  . Sexual activity: Yes    Comment: same sex partner  Other Topics Concern  . Not on file  Social History Narrative  . Not on file   Social Determinants of Health   Financial Resource Strain: Not on file  Food Insecurity: Not on file  Transportation Needs: Not on file  Physical Activity: Not on file  Stress: Not on file  Social Connections: Not on file  Intimate Partner Violence: Not on file    Current Outpatient Medications on File Prior to Visit  Medication Sig Dispense Refill  . albuterol (VENTOLIN HFA) 108 (90 Base) MCG/ACT inhaler Inhale into the lungs.    . promethazine (PHENERGAN) 25 MG tablet Take 25 mg by mouth every 6 (six) hours as  needed for nausea or vomiting.    . promethazine (PROMETHEGAN) 25 MG suppository UNWRAP AND INSERT 1 SUPPOSITORY RECTALLY EVERY 4 HOURS AS NEEDED FOR NAUSEA    . valACYclovir (VALTREX) 500 MG tablet   4  . ZOLMitriptan (ZOMIG) 2.5 MG tablet Take 2.5 mg by mouth as needed for migraine or headache. May repeat in 2 hours if headache persists or recurs.    . busPIRone (BUSPAR) 7.5 MG tablet      No current facility-administered medications on file prior to visit.    Allergies  Allergen Reactions  . Doxycycline Rash  . Fish Allergy Anaphylaxis  . Penicillins Rash  . Amoxicillin-Pot Clavulanate Other (See Comments)     Review of Systems Constitutional: negative for chills, fatigue, fevers and sweats Eyes:  negative for irritation, redness and visual disturbance Ears, nose, mouth, throat, and face: negative for hearing loss, nasal congestion, snoring and tinnitus Respiratory: negative for asthma, cough, sputum Cardiovascular: negative for chest pain, dyspnea, exertional chest pressure/discomfort, irregular heart beat, palpitations and syncope Gastrointestinal: negative for abdominal pain, change in bowel habits, nausea and vomiting Genitourinary: negative for abnormal menstrual periods, genital lesions, sexual problems and vaginal discharge, dysuria and urinary incontinence.  Integument/breast: negative for breast lump, breast tenderness and nipple discharge Hematologic/lymphatic: negative for bleeding and easy bruising Musculoskeletal:negative for back pain and muscle weakness Neurological: negative for dizziness, headaches, vertigo and weakness. Positive for headaches/migraines Endocrine: negative for diabetic symptoms including polydipsia, polyuria and skin dryness Allergic/Immunologic: negative for hay fever and urticaria      Objective:  Blood pressure 134/85, pulse 69, height 5\' 4"  (1.626 m), weight 179 lb 9.6 oz (81.5 kg). Body mass index is 30.83 kg/m.  General Appearance:    Alert, cooperative, no distress, appears stated age, obesity (mild)  Head:    Normocephalic, without obvious abnormality, atraumatic  Eyes:    PERRL, conjunctiva/corneas clear, EOM's intact, both eyes  Ears:    Normal external ear canals, both ears  Nose:   Nares normal, septum midline, mucosa normal, no drainage or sinus tenderness  Throat:   Lips, mucosa, and tongue normal; teeth and gums normal  Neck:   Supple, symmetrical, trachea midline, no adenopathy; thyroid: no enlargement/tenderness/nodules; no carotid bruit or JVD  Back:     Symmetric, no curvature, ROM normal, no CVA tenderness  Lungs:     Clear to auscultation bilaterally, respirations unlabored  Chest Wall:    No tenderness or deformity   Heart:     Regular rate and rhythm, S1 and S2 normal, no murmur, rub or gallop  Breast Exam:    No tenderness, masses, or nipple abnormality  Abdomen:     Soft, non-tender, bowel sounds active all four quadrants, no masses, no organomegaly.    Genitalia:    Pelvic:external genitalia normal, vagina with small amount of thin white discharge, no lesions or tenderness.  Rectovaginal septum  normal. Uterus and cervix surgically absent.  Left adnexa non-tender, no masses. Right adnexa surgically absent.   Rectal:    Normal external sphincter.  No hemorrhoids appreciated. Internal exam not done.   Extremities:   Extremities normal, atraumatic, no cyanosis or edema  Pulses:   2+ and symmetric all extremities  Skin:   Skin color, texture, turgor normal, no rashes or lesions  Lymph nodes:   Cervical, supraclavicular, and axillary nodes normal  Neurologic:   CNII-XII intact, normal strength, sensation and reflexes throughout   .  Labs:  Labs reviewed in Care Everywhere performed 02/08/2019  Assessment:   Encounter for well woman exam with routine gynecological exam Other migraine without status migrainosus, not intractable Obesity Menopausal state  Plan:    Blood tests: None ordered.  Labs up to date Breast self exam technique reviewed and patient encouraged to perform self-exam monthly. Discussed healthy lifestyle modifications.  Colonoscopy up to date.  Mammogram up to date. . Pap smear no longer needed.  Flu vaccine up to date, received last Friday.  COVID vaccination: has completed series and received booster.  Migraines, currently managing with Excedrin but still notes daily headaches. Previously taken off Topamax due to increase in ocular pressure. To discuss further management with PCP, or could consider Neurology consult.  RTC in 1 year.     Hildred Laser, MD [facility]

## 2020-10-25 NOTE — Patient Instructions (Addendum)
Preventive Care 50-50 Years Old, Female Preventive care refers to visits with your health care provider and lifestyle choices that can promote health and wellness. This includes:  A yearly physical exam. This may also be called an annual well check.  Regular dental visits and eye exams.  Immunizations.  Screening for certain conditions.  Healthy lifestyle choices, such as eating a healthy diet, getting regular exercise, not using drugs or products that contain nicotine and tobacco, and limiting alcohol use. What can I expect for my preventive care visit? Physical exam Your health care provider will check your:  Height and weight. This may be used to calculate body mass index (BMI), which tells if you are at a healthy weight.  Heart rate and blood pressure.  Skin for abnormal spots. Counseling Your health care provider may ask you questions about your:  Alcohol, tobacco, and drug use.  Emotional well-being.  Home and relationship well-being.  Sexual activity.  Eating habits.  Work and work environment.  Method of birth control.  Menstrual cycle.  Pregnancy history. What immunizations do I need?  Influenza (flu) vaccine  This is recommended every year. Tetanus, diphtheria, and pertussis (Tdap) vaccine  You may need a Td booster every 10 years. Varicella (chickenpox) vaccine  You may need this if you have not been vaccinated. Zoster (shingles) vaccine  You may need this after age 50. Measles, mumps, and rubella (MMR) vaccine  You may need at least one dose of MMR if you were born in 1957 or later. You may also need a second dose. Pneumococcal conjugate (PCV13) vaccine  You may need this if you have certain conditions and were not previously vaccinated. Pneumococcal polysaccharide (PPSV23) vaccine  You may need one or two doses if you smoke cigarettes or if you have certain conditions. Meningococcal conjugate (MenACWY) vaccine  You may need this if you  have certain conditions. Hepatitis A vaccine  You may need this if you have certain conditions or if you travel or work in places where you may be exposed to hepatitis A. Hepatitis B vaccine  You may need this if you have certain conditions or if you travel or work in places where you may be exposed to hepatitis B. Haemophilus influenzae type b (Hib) vaccine  You may need this if you have certain conditions. Human papillomavirus (HPV) vaccine  If recommended by your health care provider, you may need three doses over 6 months. You may receive vaccines as individual doses or as more than one vaccine together in one shot (combination vaccines). Talk with your health care provider about the risks and benefits of combination vaccines. What tests do I need? Blood tests  Lipid and cholesterol levels. These may be checked every 5 years, or more frequently if you are over 50 years old.  Hepatitis C test.  Hepatitis B test. Screening  Lung cancer screening. You may have this screening every year starting at age 50 if you have a 30-pack-year history of smoking and currently smoke or have quit within the past 15 years.  Colorectal cancer screening. All adults should have this screening starting at age 50 and continuing until age 50. Your health care provider may recommend screening at age 45 if you are at increased risk. You will have tests every 1-10 years, depending on your results and the type of screening test.  Diabetes screening. This is done by checking your blood sugar (glucose) after you have not eaten for a while (fasting). You may have this   done every 1-3 years.  Mammogram. This may be done every 1-2 years. Talk with your health care provider about when you should start having regular mammograms. This may depend on whether you have a family history of breast cancer.  BRCA-related cancer screening. This may be done if you have a family history of breast, ovarian, tubal, or peritoneal  cancers.  Pelvic exam and Pap test. This may be done every 3 years starting at age 50. Starting at age 50, this may be done every 5 years if you have a Pap test in combination with an HPV test. Other tests  Sexually transmitted disease (STD) testing.  Bone density scan. This is done to screen for osteoporosis. You may have this scan if you are at high risk for osteoporosis. Follow these instructions at home: Eating and drinking  Eat a diet that includes fresh fruits and vegetables, whole grains, lean protein, and low-fat dairy.  Take vitamin and mineral supplements as recommended by your health care provider.  Do not drink alcohol if: ? Your health care provider tells you not to drink. ? You are pregnant, may be pregnant, or are planning to become pregnant.  If you drink alcohol: ? Limit how much you have to 0-1 drink a [place]. ? Be aware of how much alcohol is in your drink. In the [place] one drink equals one 12 oz bottle of beer (355 mL), one 5 oz glass of wine (148 mL), or one 1 oz glass of hard liquor (44 mL). Lifestyle  Take daily care of your teeth and gums.  Stay active. Exercise for at least 30 minutes on 5 or more days each week.  Do not use any products that contain nicotine or tobacco, such as cigarettes, e-cigarettes, and chewing tobacco. If you need help quitting, ask your health care provider.  If you are sexually active, practice safe sex. Use a condom or other form of birth control (contraception) in order to prevent pregnancy and STIs (sexually transmitted infections).  If told by your health care provider, take low-dose aspirin daily starting at age 50. What's next?  Visit your health care provider once a year for a well check visit.  Ask your health care provider how often you should have your eyes and teeth checked.  Stay up to date on all vaccines. This information is not intended to replace advice given to you by your health care provider. Make sure you  discuss any questions you have with your health care provider. Document Revised: 07/09/2018 Document Reviewed: 07/09/2018 Elsevier Patient Education  2020 [employer] Breast Self-Awareness Breast self-awareness is knowing how your breasts look and feel. Doing breast self-awareness is important. It allows you to catch a breast problem early while it is still small and can be treated. All women should do breast self-awareness, including women who have had breast implants. Tell your doctor if you notice a change in your breasts. What you need:  A mirror.  A well-lit room. How to do a breast self-exam A breast self-exam is one way to learn what is normal for your breasts and to check for changes. To do a breast self-exam: Look for changes  1. Take off all the clothes above your waist. 2. Stand in front of a mirror in a room with good lighting. 3. Put your hands on your hips. 4. Push your hands down. 5. Look at your breasts and nipples in the mirror to see if one breast or nipple looks different from the   other. Check to see if: ? The shape of one breast is different. ? The size of one breast is different. ? There are wrinkles, dips, and bumps in one breast and not the other. 6. Look at each breast for changes in the skin, such as: ? Redness. ? Scaly areas. 7. Look for changes in your nipples, such as: ? Liquid around the nipples. ? Bleeding. ? Dimpling. ? Redness. ? A change in where the nipples are. Feel for changes  1. Lie on your back on the floor. 2. Feel each breast. To do this, follow these steps: ? Pick a breast to feel. ? Put the arm closest to that breast above your head. ? Use your other arm to feel the nipple area of your breast. Feel the area with the pads of your three middle fingers by making small circles with your fingers. For the first circle, press lightly. For the second circle, press harder. For the third circle, press even harder. ? Keep making circles with  your fingers at the different pressures as you move down your breast. Stop when you feel your ribs. ? Move your fingers a little toward the center of your body. ? Start making circles with your fingers again, this time going up until you reach your collarbone. ? Keep making up-and-down circles until you reach your armpit. Remember to keep using the three pressures. ? Feel the other breast in the same way. 3. Sit or stand in the tub or shower. 4. With soapy water on your skin, feel each breast the same way you did in step 2 when you were lying on the floor. Write down what you find Writing down what you find can help you remember what to tell your doctor. Write down:  What is normal for each breast.  Any changes you find in each breast, including: ? The kind of changes you find. ? Whether you have pain. ? Size and location of any lumps.  When you last had your menstrual period. General tips  Check your breasts every month.  If you are breastfeeding, the best time to check your breasts is after you feed your baby or after you use a breast pump.  If you get menstrual periods, the best time to check your breasts is 5-7 days after your menstrual period is over.  With time, you will become comfortable with the self-exam, and you will begin to know if there are changes in your breasts. Contact a doctor if you:  See a change in the shape or size of your breasts or nipples.  See a change in the skin of your breast or nipples, such as red or scaly skin.  Have fluid coming from your nipples that is not normal.  Find a lump or thick area that was not there before.  Have pain in your breasts.  Have any concerns about your breast health. Summary  Breast self-awareness includes looking for changes in your breasts, as well as feeling for changes within your breasts.  Breast self-awareness should be done in front of a mirror in a well-lit room.  You should check your breasts every month.  If you get menstrual periods, the best time to check your breasts is 5-7 days after your menstrual period is over.  Let your doctor know of any changes you see in your breasts, including changes in size, changes on the skin, pain or tenderness, or fluid from your nipples that is not normal. This information is not   is not intended to replace advice given to you by your health care provider. Make sure you discuss any questions you have with your health care provider. Document Revised: 06/16/2018 Document Reviewed: 06/16/2018 Elsevier Patient Education  [place].

## 2020-10-25 NOTE — Progress Notes (Signed)
Pt present for annual exam. No complaints. °

## 2020-10-26 ENCOUNTER — Encounter: Payer: Self-pay | Admitting: Obstetrics and Gynecology

## 2020-12-08 ENCOUNTER — Encounter: Payer: BC Managed Care – PPO | Admitting: Obstetrics and Gynecology

## 2021-05-02 ENCOUNTER — Encounter: Payer: Self-pay | Admitting: Obstetrics and Gynecology

## 2021-09-04 ENCOUNTER — Ambulatory Visit
Admission: RE | Admit: 2021-09-04 | Discharge: 2021-09-04 | Disposition: A | Discharge status: Home / Self Care | Payer: BC Managed Care – PPO | Source: Ambulatory Visit | Attending: Emergency Medicine | Admitting: Emergency Medicine

## 2021-09-04 ENCOUNTER — Other Ambulatory Visit: Payer: Self-pay

## 2021-09-04 VITALS — BP 138/74 | HR 77 | Temp 98.4°F | Resp 18 | Ht 64.0 in | Wt 170.0 lb

## 2021-09-04 DIAGNOSIS — J069 Acute upper respiratory infection, unspecified: Secondary | ICD-10-CM | POA: Diagnosis not present

## 2021-09-04 DIAGNOSIS — G43809 Other migraine, not intractable, without status migrainosus: Secondary | ICD-10-CM | POA: Diagnosis not present

## 2021-09-04 MED ORDER — AZITHROMYCIN 250 MG PO TABS: 250.0000 mg | ORAL_TABLET | Freq: Every day | ORAL | 0 refills | Status: DC

## 2021-09-04 MED ORDER — FLUCONAZOLE 150 MG PO TABS: ORAL_TABLET | ORAL | 0 refills | Status: DC

## 2021-09-04 MED ORDER — KETOROLAC TROMETHAMINE 60 MG/2ML IM SOLN
60.0000 mg | Freq: Once | INTRAMUSCULAR | Status: AC
  Administered 2021-09-04: 60 mg via INTRAMUSCULAR

## 2021-09-04 MED ORDER — BUTALBITAL-APAP-CAFFEINE 50-325-40 MG PO TABS: ORAL_TABLET | ORAL | 0 refills | Status: AC

## 2021-09-04 MED ORDER — PREDNISONE 10 MG (21) PO TBPK: ORAL_TABLET | Freq: Every day | ORAL | 0 refills | Status: DC

## 2021-09-04 MED ORDER — ALBUTEROL SULFATE HFA 108 (90 BASE) MCG/ACT IN AERS: 2.0000 | INHALATION_SPRAY | RESPIRATORY_TRACT | 0 refills | Status: AC | PRN

## 2021-09-04 NOTE — Discharge Instructions (Addendum)
Rest,push fluids,take meds as directed. Fiorcet will make you sleepy, no driving or making decisions while taking meds. We have refilled your inhaler. Follow up with PCP for general medical issues, return to Urgent Care as needed. You received Toradol in Urgent care, do not take additional NSAIDS(ibuprofen or aleve, aspirin for next  8 hours). Tylenol is in your Fioricet and well.

## 2021-09-04 NOTE — ED Triage Notes (Signed)
Pt here with C/O Cough and Headache for over a week.

## 2021-09-04 NOTE — ED Provider Notes (Signed)
[facility]    CSN: [id number] Arrival date & time: 09/04/21  1345      History   Chief Complaint Chief Complaint  Patient presents with   Cough   Headache     HPI Heidi Pitts is a 50 y.o. female.   50 year old female, Heidi Pitts, presents to urgent care chief complaint of cough and headache for over a week.  Patient states her partner recently had a upper respiratory infection and improved however she has not and has gradually worsened.  Patient states she is coughing when she is given her migraine. "  I just want to get rid of this headache", patient reports frontal headache,pain,pressure.  Patient states she is coughing and has used her inhaler but her inhaler is about 51 years old,requesting refill.   The history is provided by the patient. No language interpreter was used.   Past Medical History:  Diagnosis Date   Anxiety    Endometriosis    Migraines    Pelvic pain in female    Chronic rt lower qaudrant pain   Perimenopausal vasomotor symptoms    Periodic fever syndrome [facility])     Patient Active Problem List   Diagnosis Date Noted   Acute upper respiratory infection 09/04/2021   Endometriosis 02/13/2016   Status post laparoscopic supracervical hysterectomy 02/13/2016   Status post trachelectomy 02/13/2016   Dyslipidemia 02/13/2016   Headache, migraine 02/13/2016   Colon, diverticulosis 12/25/2015    Past Surgical History:  Procedure Laterality Date   ABDOMINAL HYSTERECTOMY     rso   APPENDECTOMY     CHOLECYSTECTOMY     LAPAROSCOPIC LYSIS OF ADHESIONS     vaginal trachelectomy  08/2013    OB History     Gravida  0   Para  0   Term  0   Preterm  0   AB  0   Living  0      SAB  0   IAB  0   Ectopic  0   Multiple  0   Live Births               Home Medications    Prior to Admission medications   Medication Sig Start Date End Date Taking? Authorizing Provider  albuterol (VENTOLIN HFA) 108 (90 Base) MCG/ACT inhaler  Inhale 2 puffs into the lungs every 4 (four) hours as needed for wheezing or shortness of breath. 09/04/21  Yes Tamara Kenyon, Para March, NP  azithromycin (ZITHROMAX) 250 MG tablet Take 1 tablet (250 mg total) by mouth daily. Take first 2 tablets together, then 1 every Tena until finished. 09/04/21  Yes Charles Andringa, Para March, NP  busPIRone (BUSPAR) 7.5 MG tablet  10/07/20  Yes [provider]  butalbital-acetaminophen-caffeine (FIORICET) 50-325-40 MG tablet Take 2 tablets every 6 hours as needed for headache, do not exceed 6 tablets in 24 hours. 09/04/21  Yes Lavarr President, Para March, NP  fluconazole (DIFLUCAN) 150 MG tablet Take 1 tab po on Joa 0, 3, 7 09/04/21  Yes Stancil Deisher, Para March, NP  predniSONE (STERAPRED UNI-PAK 21 TAB) 10 MG (21) TBPK tablet Take by mouth daily. 09/04/21  Yes Irisha Grandmaison, Para March, NP  valACYclovir (VALTREX) 500 MG tablet  12/25/15  Yes [provider]  ZOLMitriptan (ZOMIG) 2.5 MG tablet Take 2.5 mg by mouth as needed for migraine or headache. May repeat in 2 hours if headache persists or recurs.   Yes [provider]  promethazine (PHENERGAN) 25 MG suppository UNWRAP AND INSERT 1 SUPPOSITORY RECTALLY  EVERY 4 HOURS AS NEEDED FOR NAUSEA 01/29/19   [provider]  promethazine (PHENERGAN) 25 MG tablet Take 25 mg by mouth every 6 (six) hours as needed for nausea or vomiting.    [provider]    Family History Family History  Problem Relation Age of Onset   Heart disease Father    Diabetes Father    Bladder Cancer Maternal Aunt    Breast cancer Paternal Aunt    Pancreatic cancer Maternal Grandmother    Colon cancer Maternal Grandmother    Breast cancer Paternal Grandmother    Ovarian cancer Neg Hx     Social History Social History   Tobacco Use   Smoking status: Never   Smokeless tobacco: Never  Vaping Use   Vaping Use: Never used  Substance Use Topics   Alcohol use: Yes    Comment: rare   Drug use: No     Allergies    Doxycycline, Fish allergy, Penicillins, and Amoxicillin-pot clavulanate   Review of Systems Review of Systems  Constitutional:  Positive for fatigue. Negative for fever.  HENT:  Positive for congestion, sinus pressure and sinus pain.   Respiratory:  Positive for cough. Negative for wheezing.   Cardiovascular:  Negative for chest pain.  Neurological:  Positive for headaches.  All other systems reviewed and are negative.   Physical Exam Triage Vital Signs ED Triage Vitals  Enc Vitals Group     BP      Pulse      Resp      Temp      Temp src      SpO2      Weight      Height      Head Circumference      Peak Flow      Pain Score      Pain Loc      Pain Edu?      Excl. in GC?    No data found.  Updated Vital Signs BP 138/74 (BP Location: Left Arm)   Pulse 77   Temp 98.4 F (36.9 C) (Oral)   Resp 18   Ht 5\' 4"  (1.626 m)   Wt 170 lb (77.1 kg)   SpO2 100%   BMI 29.18 kg/m   Visual Acuity Right Eye Distance:   Left Eye Distance:   Bilateral Distance:    Right Eye Near:   Left Eye Near:    Bilateral Near:     Physical Exam Vitals and nursing note reviewed.  Constitutional:      General: She is not in acute distress.    Appearance: She is well-developed and well-groomed.  HENT:     Head: Normocephalic.     Right Ear: Tympanic membrane is retracted.     Left Ear: Tympanic membrane is retracted.     Ears:     Comments: Cerumen in right ear canal    Nose: Nasal tenderness and congestion present.     Right Sinus: Maxillary sinus tenderness and frontal sinus tenderness present.     Left Sinus: Maxillary sinus tenderness and frontal sinus tenderness present.     Mouth/Throat:     Lips: Pink.     Mouth: Mucous membranes are moist.     Pharynx: Oropharynx is clear.  Eyes:     General: Lids are normal.     Conjunctiva/sclera: Conjunctivae normal.     Pupils: Pupils are equal, round, and reactive to light.  Neck:  Trachea: No tracheal deviation.   Cardiovascular:     Rate and Rhythm: Normal rate and regular rhythm.     Pulses: Normal pulses.     Heart sounds: Normal heart sounds. No murmur heard. Pulmonary:     Effort: Pulmonary effort is normal.     Breath sounds: Normal breath sounds and air entry.  Abdominal:     General: Bowel sounds are normal.     Palpations: Abdomen is soft.     Tenderness: There is no abdominal tenderness.  Musculoskeletal:        General: Normal range of motion.     Cervical back: Normal range of motion.  Lymphadenopathy:     Cervical: No cervical adenopathy.  Skin:    General: Skin is warm and dry.     Findings: No rash.  Neurological:     General: No focal deficit present.     Mental Status: She is alert and oriented to person, place, and time.     GCS: GCS eye subscore is 4. GCS verbal subscore is 5. GCS motor subscore is 6.  Psychiatric:        Attention and Perception: Attention normal.        Mood and Affect: Mood normal.        Speech: Speech normal.        Behavior: Behavior normal. Behavior is cooperative.     UC Treatments / Results  Labs (all labs ordered are listed, but only abnormal results are displayed) Labs Reviewed - No data to display  EKG   Radiology No results found.  Procedures Procedures (including critical care time)  Medications Ordered in UC Medications  ketorolac (TORADOL) injection 60 mg (60 mg Intramuscular Given 09/04/21 1413)    Initial Impression / Assessment and Plan / UC Course  I have reviewed the triage vital signs and the nursing notes.  Pertinent labs & imaging results that were available during my care of the patient were reviewed by me and considered in my medical decision making (see chart for details).    Ddx: URI, Sinusitis, Allergies, Migraine HA, Sinus HA, Viral illness.   Drink plenty of fluids. Rest. Discussed plan of care with patient will prescription for Z-Pak as she states this has worked for her in the past and she is  allergic to penicillin and doxycycline.  we will also refill her albuterol inhaler, Fioricet for headache scripted with instructions to not exceed 6 tablets in a 24-hour period.  Prednisone taper dose given to help with sinus pressure and cough as well , patient also requested Diflucan as antibiotics give her a yeast infection.  Patient advised if she is not improving will need to go to PCP or ER for further evaluation, patient verbalized understanding this provider. Final Clinical Impressions(s) / UC Diagnoses   Final diagnoses:  Acute upper respiratory infection  Other migraine without status migrainosus, not intractable     Discharge Instructions      Rest,push fluids,take meds as directed. Fiorcet will make you sleepy, no driving or making decisions while taking meds. We have refilled your inhaler. Follow up with PCP for general medical issues, return to Urgent Care as needed. You received Toradol in Urgent care, do not take additional NSAIDS(ibuprofen or aleve, aspirin for next  8 hours). Tylenol is in your Fioricet and well.      ED Prescriptions     Medication Sig Dispense Auth. Provider   azithromycin (ZITHROMAX) 250 MG tablet Take 1 tablet (250 mg  total) by mouth daily. Take first 2 tablets together, then 1 every [place] until finished. 6 tablet Krista Godsil, NP   predniSONE (STERAPRED UNI-PAK 21 TAB) 10 MG (21) TBPK tablet Take by mouth daily. 21 tablet Evander Macaraeg, NP   butalbital-acetaminophen-caffeine (FIORICET) 50-325-40 MG tablet Take 2 tablets every 6 hours as needed for headache, do not exceed 6 tablets in 24 hours. 20 tablet Cohen Boettner, NP   albuterol (VENTOLIN HFA) 108 (90 Base) MCG/ACT inhaler Inhale 2 puffs into the lungs every 4 (four) hours as needed for wheezing or shortness of breath. 1 each Cloie Wooden, NP   fluconazole (DIFLUCAN) 150 MG tablet Take 1 tab po on Retz 0, 3, 7 3 tablet Cayenne Breault, NP      PDMP not reviewed this  encounter.   Clancy Gourd, NP 09/04/21 1553

## 2021-10-25 NOTE — Progress Notes (Unsigned)
GYNECOLOGY ANNUAL PHYSICAL EXAM PROGRESS NOTE  Subjective:    Heidi Pitts is a 50 y.o. G0P0000 female who presents for an annual exam.  The patient is sexually active. The patient participates in regular exercise: yes. Has the patient ever been transfused or tattooed?: no. The patient reports that there is not domestic violence in her life.   The patient has the following complaints today.  She has a cyst develop on the right side of her labia. She has been evaluated by PCP and diagnosed it as a Bartholin's Cyst. It soon popped or drained on its on.  Gynecologic History:  Menarche age: [age] No LMP recorded. Patient has had a hysterectomy. Contraception: status post hysterectomy History of STI's:  Last Pap: 01/2013. Results were: normal.  Denies h/o abnormal pap smears. Last mammogram: [id number]. Results were: normal   OB History  Gravida Para Term Preterm AB Living  0 0 0 0 0 0  SAB IAB Ectopic Multiple Live Births  0 0 0 0 0    Past Medical History:  Diagnosis Date   Anxiety    Endometriosis    Migraines    Pelvic pain in female    Chronic rt lower qaudrant pain   Perimenopausal vasomotor symptoms    Periodic fever syndrome (HCC)     Past Surgical History:  Procedure Laterality Date   ABDOMINAL HYSTERECTOMY     rso   APPENDECTOMY     CHOLECYSTECTOMY     LAPAROSCOPIC LYSIS OF ADHESIONS     vaginal trachelectomy  08/2013    Family History  Problem Relation Age of Onset   Heart disease Father    Diabetes Father    Bladder Cancer Maternal Aunt    Breast cancer Paternal Aunt    Pancreatic cancer Maternal Grandmother    Colon cancer Maternal Grandmother    Breast cancer Paternal Grandmother    Ovarian cancer Neg Hx     Social History   Socioeconomic History   Marital status: Married    Spouse name: Not on file   Number of children: Not on file   Years of education: Not on file   Highest education level: Not on file  Occupational History   Not on file   Tobacco Use   Smoking status: Never   Smokeless tobacco: Never  Vaping Use   Vaping Use: Never used  Substance and Sexual Activity   Alcohol use: Yes    Comment: rare   Drug use: No   Sexual activity: Yes    Comment: same sex partner  Other Topics Concern   Not on file  Social History Narrative   Not on file   Social Determinants of Health   Financial Resource Strain: Not on file  Food Insecurity: Not on file  Transportation Needs: Not on file  Physical Activity: Not on file  Stress: Not on file  Social Connections: Not on file  Intimate Partner Violence: Not on file    Current Outpatient Medications on File Prior to Visit  Medication Sig Dispense Refill   albuterol (VENTOLIN HFA) 108 (90 Base) MCG/ACT inhaler Inhale 2 puffs into the lungs every 4 (four) hours as needed for wheezing or shortness of breath. 1 each 0   azithromycin (ZITHROMAX) 250 MG tablet Take 1 tablet (250 mg total) by mouth daily. Take first 2 tablets together, then 1 every Phifer until finished. 6 tablet 0   busPIRone (BUSPAR) 7.5 MG tablet      butalbital-acetaminophen-caffeine (FIORICET)  50-325-40 MG tablet Take 2 tablets every 6 hours as needed for headache, do not exceed 6 tablets in 24 hours. 20 tablet 0   fluconazole (DIFLUCAN) 150 MG tablet Take 1 tab po on Plack 0, 3, 7 3 tablet 0   predniSONE (STERAPRED UNI-PAK 21 TAB) 10 MG (21) TBPK tablet Take by mouth daily. 21 tablet 0   promethazine (PHENERGAN) 25 MG suppository UNWRAP AND INSERT 1 SUPPOSITORY RECTALLY EVERY 4 HOURS AS NEEDED FOR NAUSEA     promethazine (PHENERGAN) 25 MG tablet Take 25 mg by mouth every 6 (six) hours as needed for nausea or vomiting.     valACYclovir (VALTREX) 500 MG tablet   4   ZOLMitriptan (ZOMIG) 2.5 MG tablet Take 2.5 mg by mouth as needed for migraine or headache. May repeat in 2 hours if headache persists or recurs.     No current facility-administered medications on file prior to visit.    Allergies  Allergen  Reactions   Doxycycline Rash   Fish Allergy Anaphylaxis   Penicillins Rash   Amoxicillin-Pot Clavulanate Other (See Comments)     Review of Systems Constitutional: negative for chills, fatigue, fevers and sweats Eyes: negative for irritation, redness and visual disturbance Ears, nose, mouth, throat, and face: negative for hearing loss, nasal congestion, snoring and tinnitus Respiratory: negative for asthma, cough, sputum Cardiovascular: negative for chest pain, dyspnea, exertional chest pressure/discomfort, irregular heart beat, palpitations and syncope Gastrointestinal: negative for abdominal pain, change in bowel habits, nausea and vomiting Genitourinary: negative for abnormal menstrual periods, genital lesions, sexual problems and vaginal discharge, dysuria and urinary incontinence Integument/breast: negative for breast lump, breast tenderness and nipple discharge Hematologic/lymphatic: negative for bleeding and easy bruising Musculoskeletal:negative for back pain and muscle weakness Neurological: negative for dizziness, headaches, vertigo and weakness Endocrine: negative for diabetic symptoms including polydipsia, polyuria and skin dryness Allergic/Immunologic: negative for hay fever and urticaria      Objective:  Blood pressure 136/84, pulse 66, resp. rate 16, height 5\' 4"  (1.626 m), weight 179 lb 9.6 oz (81.5 kg). Body mass index is 30.83 kg/m.    General Appearance:    Alert, cooperative, no distress, appears stated age  Head:    Normocephalic, without obvious abnormality, atraumatic  Eyes:    PERRL, conjunctiva/corneas clear, EOM's intact, both eyes  Ears:    Normal external ear canals, both ears  Nose:   Nares normal, septum midline, mucosa normal, no drainage or sinus tenderness  Throat:   Lips, mucosa, and tongue normal; teeth and gums normal  Neck:   Supple, symmetrical, trachea midline, no adenopathy; thyroid: no enlargement/tenderness/nodules; no carotid bruit or JVD   Back:     Symmetric, no curvature, ROM normal, no CVA tenderness  Lungs:     Clear to auscultation bilaterally, respirations unlabored  Chest Wall:    No tenderness or deformity   Heart:    Regular rate and rhythm, S1 and S2 normal, no murmur, rub or gallop  Breast Exam:    No tenderness, masses, or nipple abnormality  Abdomen:     Soft, non-tender, bowel sounds active all four quadrants, no masses, no organomegaly.    Genitalia:    Pelvic:external genitalia normal, vagina without lesions, discharge, or tenderness, rectovaginal septum  normal. Cervix normal in appearance, no cervical motion tenderness, no adnexal masses or tenderness.  Uterus normal size, shape, mobile, regular contours, nontender.  Rectal:    Normal external sphincter.  No hemorrhoids appreciated. Internal exam not done.   Extremities:  Extremities normal, atraumatic, no cyanosis or edema  Pulses:   2+ and symmetric all extremities  Skin:   Skin color, texture, turgor normal, no rashes or lesions  Lymph nodes:   Cervical, supraclavicular, and axillary nodes normal  Neurologic:   CNII-XII intact, normal strength, sensation and reflexes throughout   .  Labs:  Lab Results  Component Value Date   WBC 6.8 10/31/2015   HGB 13.2 10/31/2015   HCT 39.0 10/31/2015   MCV 90.3 10/31/2015   PLT 189 10/31/2015    Lab Results  Component Value Date   CREATININE 0.59 10/31/2015   BUN 12 10/31/2015   NA 138 10/31/2015   K 3.6 10/31/2015   CL 104 10/31/2015   CO2 25 10/31/2015    Lab Results  Component Value Date   ALT 12 (L) 10/31/2015   AST 18 10/31/2015   ALKPHOS 59 10/31/2015   BILITOT 0.5 10/31/2015    No results found for: TSH   Assessment:   1. Encounter for well woman exam with routine gynecological exam   2. Encounter for screening mammogram for malignant neoplasm of breast      Plan:  Blood tests: {blood tests:13147}. Breast self exam technique reviewed and patient encouraged to perform self-exam  monthly. Contraception: status post hysterectomy. Discussed healthy lifestyle modifications. Mammogram ordered Pap smear  Not medically necessary . COVID vaccination status: UTD FLU: UTD. Done today. Follow up in 1 year for annual exam   Hildred Laser, MD [facility]

## 2021-10-25 NOTE — Patient Instructions (Signed)
Breast Self-Awareness Breast self-awareness means being familiar with how your breasts look and feel. It involves checking your breasts regularly and reporting any changes to your health care provider. Practicing breast self-awareness is important. Sometimes changes may not be harmful (are benign), but sometimes a change in your breasts can be a sign of a serious medical problem. It is important to learn how to do this procedure correctly so that you can catch problems early, when treatment is more likely to be successful. All women should practice breast self-awareness, including women who have had breast implants. What you need: A mirror. A well-lit room. How to do a breast self-exam A breast self-exam is one way to learn what is normal for your breasts and whether your breasts are changing. To do a breast self-exam: Look for changes  Remove all the clothing above your waist. Stand in front of a mirror in a room with good lighting. Put your hands on your hips. Push your hands firmly downward. Compare your breasts in the mirror. Look for differences between them (asymmetry), such as: Differences in shape. Differences in size. Puckers, dips, and bumps in one breast and not the other. Look at each breast for changes in the skin, such as: Redness. Scaly areas. Look for changes in your nipples, such as: Discharge. Bleeding. Dimpling. Redness. A change in position. Feel for changes Carefully feel your breasts for lumps and changes. It is best to do this while lying on your back on the floor, and again while sitting or standing in the tub or shower with soapy water on your skin. Feel each breast in the following way: Place the arm on the side of the breast you are examining above your head. Feel your breast with the other hand. Start in the nipple area and make -inch (2 cm) overlapping circles to feel your breast. Use the pads of your three middle fingers to do this. Apply light pressure,  then medium pressure, then firm pressure. The light pressure will allow you to feel the tissue closest to the skin. The medium pressure will allow you to feel the tissue that is a little deeper. The firm pressure will allow you to feel the tissue close to the ribs. Continue the overlapping circles, moving downward over the breast until you feel your ribs below your breast. Move one finger-width toward the center of the body. Continue to use the -inch (2 cm) overlapping circles to feel your breast as you move slowly up toward your collarbone. Continue the up-and-down exam using all three pressures until you reach your armpit.  Write down what you find Writing down what you find can help you remember what to discuss with your health care provider. Write down: What is normal for each breast. Any changes that you find in each breast, including: The kind of changes you find. Any pain or tenderness. Size and location of any lumps. Where you are in your menstrual cycle, if you are still menstruating. General tips and recommendations Examine your breasts every month. If you are breastfeeding, the best time to examine your breasts is after a feeding or after using a breast pump. If you menstruate, the best time to examine your breasts is 5-7 days after your period. Breasts are generally lumpier during menstrual periods, and it may be more difficult to notice changes. With time and practice, you will become more familiar with the variations in your breasts and more comfortable with the exam. Contact a health care provider if you:  See a change in the shape or size of your breasts or nipples. See a change in the skin of your breast or nipples, such as a reddened or scaly area. Have unusual discharge from your nipples. Find a lump or thick area that was not there before. Have pain in your breasts. Have any concerns related to your breast health. Summary Breast self-awareness includes looking for  physical changes in your breasts, as well as feeling for any changes within your breasts. Breast self-awareness should be performed in front of a mirror in a well-lit room. You should examine your breasts every month. If you menstruate, the best time to examine your breasts is 5-7 days after your menstrual period. Let your health care provider know of any changes you notice in your breasts, including changes in size, changes on the skin, pain or tenderness, or unusual fluid from your nipples. This information is not intended to replace advice given to you by your health care provider. Make sure you discuss any questions you have with your health care provider. Document Revised: 06/16/2018 Document Reviewed: 06/16/2018 Elsevier Patient Education  2022 Else[employer] Preventive Care 44-91 Years Old, Female Preventive care refers to lifestyle choices and visits with your health care provider that can promote health and wellness. Preventive care visits are also called wellness exams. What can I expect for my preventive care visit? Counseling Your health care provider may ask you questions about your: Medical history, including: Past medical problems. Family medical history. Pregnancy history. Current health, including: Menstrual cycle. Method of birth control. Emotional well-being. Home life and relationship well-being. Sexual activity and sexual health. Lifestyle, including: Alcohol, nicotine or tobacco, and drug use. Access to firearms. Diet, exercise, and sleep habits. Work and work Statistician. Sunscreen use. Safety issues such as seatbelt and bike helmet use. Physical exam Your health care provider will check your: Height and weight. These may be used to calculate your BMI (body mass index). BMI is a measurement that tells if you are at a healthy weight. Waist circumference. This measures the distance around your waistline. This measurement also tells if you are at a healthy weight  and may help predict your risk of certain diseases, such as type 2 diabetes and high blood pressure. Heart rate and blood pressure. Body temperature. Skin for abnormal spots. What immunizations do I need? Vaccines are usually given at various ages, according to a schedule. Your health care provider will recommend vaccines for you based on your age, medical history, and lifestyle or other factors, such as travel or where you work. What tests do I need? Screening Your health care provider may recommend screening tests for certain conditions. This may include: Lipid and cholesterol levels. Diabetes screening. This is done by checking your blood sugar (glucose) after you have not eaten for a while (fasting). Pelvic exam and Pap test. Hepatitis B test. Hepatitis C test. HIV (human immunodeficiency virus) test. STI (sexually transmitted infection) testing, if you are at risk. Lung cancer screening. Colorectal cancer screening. Mammogram. Talk with your health care provider about when you should start having regular mammograms. This may depend on whether you have a family history of breast cancer. BRCA-related cancer screening. This may be done if you have a family history of breast, ovarian, tubal, or peritoneal cancers. Bone density scan. This is done to screen for osteoporosis. Talk with your health care provider about your test results, treatment options, and if necessary, the need for more tests. Follow these instructions at home: Eating  and drinking  Eat a diet that includes fresh fruits and vegetables, whole grains, lean protein, and low-fat dairy products. Take vitamin and mineral supplements as recommended by your health care provider. Do not drink alcohol if: Your health care provider tells you not to drink. You are pregnant, may be pregnant, or are planning to become pregnant. If you drink alcohol: Limit how much you have to 0-1 drink a [place]. Know how much alcohol is in your drink.  In the [place] one drink equals one 12 oz bottle of beer (355 mL), one 5 oz glass of wine (148 mL), or one 1 oz glass of hard liquor (44 mL). Lifestyle Brush your teeth every morning and night with fluoride toothpaste. Floss one time each [place]. Exercise for at least 30 minutes 5 or more days each week. Do not use any products that contain nicotine or tobacco. These products include cigarettes, chewing tobacco, and vaping devices, such as e-cigarettes. If you need help quitting, ask your health care provider. Do not use drugs. If you are sexually active, practice safe sex. Use a condom or other form of protection to prevent STIs. If you do not wish to become pregnant, use a form of birth control. If you plan to become pregnant, see your health care provider for a prepregnancy visit. Take aspirin only as told by your health care provider. Make sure that you understand how much to take and what form to take. Work with your health care provider to find out whether it is safe and beneficial for you to take aspirin daily. Find healthy ways to manage stress, such as: Meditation, yoga, or listening to music. Journaling. Talking to a trusted person. Spending time with friends and family. Minimize exposure to UV radiation to reduce your risk of skin cancer. Safety Always wear your seat belt while driving or riding in a vehicle. Do not drive: If you have been drinking alcohol. Do not ride with someone who has been drinking. When you are tired or distracted. While texting. If you have been using any mind-altering substances or drugs. Wear a helmet and other protective equipment during sports activities. If you have firearms in your house, make sure you follow all gun safety procedures. Seek help if you have been physically or sexually abused. What's next? Visit your health care provider once a year for an annual wellness visit. Ask your health care provider how often you should have your eyes and teeth  checked. Stay up to date on all vaccines. This information is not intended to replace advice given to you by your health care provider. Make sure you discuss any questions you have with your health care provider. Document Revised: 04/25/2021 Document Reviewed: 04/25/2021 Elsevier Patient Education  [place].

## 2021-10-26 ENCOUNTER — Other Ambulatory Visit: Payer: Self-pay

## 2021-10-26 ENCOUNTER — Encounter: Payer: Self-pay | Admitting: Obstetrics and Gynecology

## 2021-10-26 ENCOUNTER — Ambulatory Visit: Payer: BC Managed Care – PPO | Admitting: Obstetrics and Gynecology

## 2021-10-26 VITALS — BP 136/84 | HR 66 | Resp 16 | Ht 64.0 in | Wt 179.6 lb

## 2021-12-07 ENCOUNTER — Encounter: Payer: Self-pay | Admitting: Obstetrics and Gynecology

## 2021-12-07 ENCOUNTER — Ambulatory Visit (INDEPENDENT_AMBULATORY_CARE_PROVIDER_SITE_OTHER): Payer: BC Managed Care – PPO | Admitting: Obstetrics and Gynecology

## 2021-12-07 ENCOUNTER — Other Ambulatory Visit: Payer: Self-pay

## 2021-12-07 VITALS — BP 133/88 | HR 60 | Ht 64.0 in | Wt 180.0 lb

## 2021-12-07 DIAGNOSIS — Z1231 Encounter for screening mammogram for malignant neoplasm of breast: Secondary | ICD-10-CM

## 2021-12-07 DIAGNOSIS — Z01419 Encounter for gynecological examination (general) (routine) without abnormal findings: Secondary | ICD-10-CM | POA: Diagnosis not present

## 2021-12-07 NOTE — Progress Notes (Signed)
GYNECOLOGY ANNUAL PHYSICAL EXAM PROGRESS NOTE  Subjective:    Heidi Pitts is a 51 y.o. G0P0000 female who presents for an annual exam. The patient is sexually active (same sex marriage). The patient wears seatbelts: yes. The patient participates in regular exercise: yes (walking daily). Has the patient ever been transfused or tattooed?: no. The patient reports that there is not domestic violence in her life.   The patient has the following complaints today:  Notes a bump that occurred on her vagina, occurred in September. Notes it popped after a few days and was filled with small amount of pus. Has not occurred again since then. Has never had anything like this happen before. Notes that she took a picture. PCP told her it might be have been a Bartholin's abscess.   Gynecologic History No LMP recorded. Patient has had a hysterectomy. Also with right oophorectomy. Menarche age: 70 Contraception: status post hysterectomy History of STI's: Denies Last Pap: 01/2013. Results were: normal.  Denies h/o abnormal pap smears. Last mammogram: 06/01/2021 (performed at [facility]). Results were: normal (reviewed in Care Everywhere) Last colonoscopy: 07/06/2020. Results were normal (per patient).   OB History  Gravida Para Term Preterm AB Living  0 0 0 0 0 0  SAB IAB Ectopic Multiple Live Births  0 0 0 0 0    Past Medical History:  Diagnosis Date   Anxiety    Endometriosis    Migraines    Pelvic pain in female    Chronic rt lower qaudrant pain   Perimenopausal vasomotor symptoms    Periodic fever syndrome (HCC)     Past Surgical History:  Procedure Laterality Date   ABDOMINAL HYSTERECTOMY     rso   APPENDECTOMY     CHOLECYSTECTOMY     LAPAROSCOPIC LYSIS OF ADHESIONS     vaginal trachelectomy  08/2013    Family History  Problem Relation Age of Onset   Heart disease Father    Diabetes Father    Bladder Cancer Maternal Aunt    Breast cancer Paternal Aunt    Pancreatic cancer  Maternal Grandmother    Colon cancer Maternal Grandmother    Breast cancer Paternal Grandmother    Ovarian cancer Neg Hx     Social History   Socioeconomic History   Marital status: Married    Spouse name: Not on file   Number of children: Not on file   Years of education: Not on file   Highest education level: Not on file  Occupational History   Not on file  Tobacco Use   Smoking status: Never   Smokeless tobacco: Never  Vaping Use   Vaping Use: Never used  Substance and Sexual Activity   Alcohol use: Yes    Comment: rare   Drug use: No   Sexual activity: Yes    Comment: same sex partner  Other Topics Concern   Not on file  Social History Narrative   Not on file   Social Determinants of Health   Financial Resource Strain: Not on file  Food Insecurity: Not on file  Transportation Needs: Not on file  Physical Activity: Not on file  Stress: Not on file  Social Connections: Not on file  Intimate Partner Violence: Not on file    Current Outpatient Medications on File Prior to Visit  Medication Sig Dispense Refill   albuterol (VENTOLIN HFA) 108 (90 Base) MCG/ACT inhaler Inhale 2 puffs into the lungs every 4 (four) hours as needed  for wheezing or shortness of breath. 1 each 0   busPIRone (BUSPAR) 7.5 MG tablet      valACYclovir (VALTREX) 500 MG tablet   4   ZOLMitriptan (ZOMIG) 2.5 MG tablet Take 2.5 mg by mouth as needed for migraine or headache. May repeat in 2 hours if headache persists or recurs.     butalbital-acetaminophen-caffeine (FIORICET) 50-325-40 MG tablet Take 2 tablets every 6 hours as needed for headache, do not exceed 6 tablets in 24 hours. (Patient not taking: Reported on 12/07/2021) 20 tablet 0   predniSONE (STERAPRED UNI-PAK 21 TAB) 10 MG (21) TBPK tablet Take by mouth daily. (Patient not taking: Reported on 12/07/2021) 21 tablet 0   promethazine (PHENERGAN) 25 MG suppository UNWRAP AND INSERT 1 SUPPOSITORY RECTALLY EVERY 4 HOURS AS NEEDED FOR NAUSEA  (Patient not taking: Reported on 12/07/2021)     No current facility-administered medications on file prior to visit.    Allergies  Allergen Reactions   Doxycycline Rash   Fish Allergy Anaphylaxis   Penicillins Rash   Amoxicillin-Pot Clavulanate Other (See Comments)     Review of Systems Constitutional: negative for chills, fatigue, fevers and sweats Eyes: negative for irritation, redness and visual disturbance Ears, nose, mouth, throat, and face: negative for hearing loss, nasal congestion, snoring and tinnitus Respiratory: negative for asthma, cough, sputum Cardiovascular: negative for chest pain, dyspnea, exertional chest pressure/discomfort, irregular heart beat, palpitations and syncope Gastrointestinal: negative for abdominal pain, change in bowel habits, nausea and vomiting Genitourinary: negative for abnormal menstrual periods, genital lesions, sexual problems and vaginal discharge, dysuria and urinary incontinence.  Integument/breast: negative for breast lump, breast tenderness and nipple discharge Hematologic/lymphatic: negative for bleeding and easy bruising Musculoskeletal:negative for back pain and muscle weakness Neurological: negative for dizziness, headaches, vertigo and weakness. Positive for headaches/migraines Endocrine: negative for diabetic symptoms including polydipsia, polyuria and skin dryness Allergic/Immunologic: negative for hay fever and urticaria      Objective:  Blood pressure 133/88, pulse 60, height 5\' 4"  (1.626 m), weight 180 lb (81.6 kg), SpO2 98 %. Body mass index is 30.9 kg/m.  General Appearance:    Alert, cooperative, no distress, appears stated age, obesity (mild)  Head:    Normocephalic, without obvious abnormality, atraumatic  Eyes:    PERRL, conjunctiva/corneas clear, EOM's intact, both eyes  Ears:    Normal external ear canals, both ears  Nose:   Nares normal, septum midline, mucosa normal, no drainage or sinus tenderness  Throat:    Lips, mucosa, and tongue normal; teeth and gums normal  Neck:   Supple, symmetrical, trachea midline, no adenopathy; thyroid: no enlargement/tenderness/nodules; no carotid bruit or JVD  Back:     Symmetric, no curvature, ROM normal, no CVA tenderness  Lungs:     Clear to auscultation bilaterally, respirations unlabored  Chest Wall:    No tenderness or deformity   Heart:    Regular rate and rhythm, S1 and S2 normal, no murmur, rub or gallop  Breast Exam:    No tenderness, masses, or nipple abnormality  Abdomen:     Soft, non-tender, bowel sounds active all four quadrants, no masses, no organomegaly.    Genitalia:    Pelvic:external genitalia normal, vagina with small amount of thin white discharge, no lesions or tenderness.  Rectovaginal septum  normal. Uterus and cervix surgically absent.  Left adnexa non-tender, no masses. Right adnexa surgically absent.   Rectal:    Normal external sphincter.  No hemorrhoids appreciated. Internal exam not done.   Extremities:  Extremities normal, atraumatic, no cyanosis or edema  Pulses:   2+ and symmetric all extremities  Skin:   Skin color, texture, turgor normal, no rashes or lesions  Lymph nodes:   Cervical, supraclavicular, and axillary nodes normal  Neurologic:   CNII-XII intact, normal strength, sensation and reflexes throughout   .  Labs:  Labs reviewed in Care Everywhere   Assessment:   Encounter for well woman exam with routine gynecological exam Obesity Menopausal state  Plan:    - Blood tests: None ordered.  Labs up to date - Breast self exam technique reviewed and patient encouraged to perform self-exam monthly. - Discussed healthy lifestyle modifications.  - Colonoscopy up to date.  - Mammogram up to date. - Pap smear no longer needed.  - Flu vaccine up to date. - Reviewed photo of vaginal lesion, did not appear to bartholin's abscess, but possibly an area of folliculitis on the right labia minora.  Has resolved and patient has  had no further issues.  - COVID vaccination: has completed series and received booster.   RTC in 1 year.     Rubie Maid, MD [facility]

## 2023-05-07 ENCOUNTER — Ambulatory Visit
Admission: EM | Admit: 2023-05-07 | Discharge: 2023-05-07 | Disposition: A | Discharge status: Home / Self Care | Payer: BC Managed Care – PPO | Attending: Physician Assistant | Admitting: Physician Assistant

## 2023-05-07 DIAGNOSIS — R051 Acute cough: Secondary | ICD-10-CM

## 2023-05-07 DIAGNOSIS — J019 Acute sinusitis, unspecified: Secondary | ICD-10-CM

## 2023-05-07 MED ORDER — IPRATROPIUM BROMIDE 0.06 % NA SOLN: 2.0000 | Freq: Four times a day (QID) | NASAL | 0 refills | Status: AC

## 2023-05-07 MED ORDER — AZITHROMYCIN 250 MG PO TABS: 250.0000 mg | ORAL_TABLET | Freq: Every day | ORAL | 0 refills | Status: DC

## 2023-05-07 NOTE — ED Provider Notes (Signed)
[facility]    CSN: [id number] Arrival date & time: 05/07/23  1131      History   Chief Complaint Chief Complaint  Patient presents with   Cough   Facial Pain    HPI Heidi Pitts is a 52 y.o. female presenting for 10-Robley history of headaches, sinus pain and pressure, nasal congestion, ear pressure, yellowish drainage from nose.  Reports symptoms have stayed the same.  No improvement or worsening in symptoms despite taking Mucinex and DayQuil.  She denies fever, fatigue, sore throat, chest pain or shortness of breath.  No sick contacts.  No other complaints.  HPI  Past Medical History:  Diagnosis Date   Anxiety    Endometriosis    Migraines    Pelvic pain in female    Chronic rt lower qaudrant pain   Perimenopausal vasomotor symptoms    Periodic fever syndrome [facility])     Patient Active Problem List   Diagnosis Date Noted   Acute upper respiratory infection 09/04/2021   Endometriosis 02/13/2016   Status post laparoscopic supracervical hysterectomy 02/13/2016   Status post trachelectomy 02/13/2016   Dyslipidemia 02/13/2016   Headache, migraine 02/13/2016   Colon, diverticulosis 12/25/2015    Past Surgical History:  Procedure Laterality Date   ABDOMINAL HYSTERECTOMY     rso   APPENDECTOMY     CHOLECYSTECTOMY     LAPAROSCOPIC LYSIS OF ADHESIONS     vaginal trachelectomy  08/2013    OB History     Gravida  0   Para  0   Term  0   Preterm  0   AB  0   Living  0      SAB  0   IAB  0   Ectopic  0   Multiple  0   Live Births               Home Medications    Prior to Admission medications   Medication Sig Start Date End Date Taking? Authorizing Provider  azithromycin (ZITHROMAX) 250 MG tablet Take 1 tablet (250 mg total) by mouth daily. Take first 2 tablets together, then 1 every [place] until finished. 05/07/23  Yes Eusebio Friendly B, PA-C  busPIRone (BUSPAR) 7.5 MG tablet  10/07/20  Yes [provider]  ipratropium  (ATROVENT) 0.06 % nasal spray Place 2 sprays into both nostrils 4 (four) times daily. 05/07/23  Yes Eusebio Friendly B, PA-C  ZOLMitriptan (ZOMIG) 2.5 MG tablet Take 2.5 mg by mouth as needed for migraine or headache. May repeat in 2 hours if headache persists or recurs.   Yes [provider]  albuterol (VENTOLIN HFA) 108 (90 Base) MCG/ACT inhaler Inhale 2 puffs into the lungs every 4 (four) hours as needed for wheezing or shortness of breath. 09/04/21   Defelice, Para March, NP  butalbital-acetaminophen-caffeine (FIORICET) 50-325-40 MG tablet Take 2 tablets every 6 hours as needed for headache, do not exceed 6 tablets in 24 hours. Patient not taking: Reported on 12/07/2021 09/04/21   Defelice, Para March, NP  predniSONE (STERAPRED UNI-PAK 21 TAB) 10 MG (21) TBPK tablet Take by mouth daily. Patient not taking: Reported on 12/07/2021 09/04/21   Defelice, Para March, NP  promethazine (PHENERGAN) 25 MG suppository UNWRAP AND INSERT 1 SUPPOSITORY RECTALLY EVERY 4 HOURS AS NEEDED FOR NAUSEA Patient not taking: Reported on 12/07/2021 01/29/19   [provider]  valACYclovir (VALTREX) 500 MG tablet  12/25/15   [provider]    Family History Family History  Problem Relation Age of Onset   Heart disease Father    Diabetes Father    Bladder Cancer Maternal Aunt    Breast cancer Paternal Aunt    Pancreatic cancer Maternal Grandmother    Colon cancer Maternal Grandmother    Breast cancer Paternal Grandmother    Ovarian cancer Neg Hx     Social History Social History   Tobacco Use   Smoking status: Never   Smokeless tobacco: Never  Vaping Use   Vaping Use: Never used  Substance Use Topics   Alcohol use: Yes    Comment: rare   Drug use: No     Allergies   Doxycycline, Fish allergy, Penicillins, and Amoxicillin-pot clavulanate   Review of Systems Review of Systems  Constitutional:  Negative for chills, diaphoresis, fatigue and fever.  HENT:  Positive for congestion, ear  pain, rhinorrhea, sinus pressure and sinus pain. Negative for sore throat.   Respiratory:  Positive for cough. Negative for shortness of breath.   Gastrointestinal:  Negative for abdominal pain, nausea and vomiting.  Musculoskeletal:  Negative for arthralgias and myalgias.  Skin:  Negative for rash.  Neurological:  Positive for headaches. Negative for weakness.  Hematological:  Negative for adenopathy.     Physical Exam Triage Vital Signs ED Triage Vitals [05/07/23 1215]  Enc Vitals Group     BP      Pulse      Resp      Temp      Temp src      SpO2      Weight      Height      Head Circumference      Peak Flow      Pain Score 5     Pain Loc      Pain Edu?      Excl. in GC?    No data found.  Updated Vital Signs BP (!) 142/90 (BP Location: Left Arm)   Pulse 78   Temp 98.4 F (36.9 C) (Oral)   SpO2 94%     Physical Exam Vitals and nursing note reviewed.  Constitutional:      General: She is not in acute distress.    Appearance: Normal appearance. She is not ill-appearing or toxic-appearing.  HENT:     Head: Normocephalic and atraumatic.     Right Ear: Tympanic membrane, ear canal and external ear normal.     Left Ear: Tympanic membrane, ear canal and external ear normal.     Nose: Congestion present.     Mouth/Throat:     Mouth: Mucous membranes are moist.     Pharynx: Oropharynx is clear.  Eyes:     General: No scleral icterus.       Right eye: No discharge.        Left eye: No discharge.     Conjunctiva/sclera: Conjunctivae normal.  Cardiovascular:     Rate and Rhythm: Normal rate and regular rhythm.     Heart sounds: Normal heart sounds.  Pulmonary:     Effort: Pulmonary effort is normal. No respiratory distress.     Breath sounds: Normal breath sounds.  Musculoskeletal:     Cervical back: Neck supple.  Skin:    General: Skin is dry.  Neurological:     General: No focal deficit present.     Mental Status: She is alert. Mental status is at  baseline.     Motor: No weakness.     Gait: Gait normal.  Psychiatric:        Mood and Affect: Mood normal.        Behavior: Behavior normal.        Thought Content: Thought content normal.      UC Treatments / Results  Labs (all labs ordered are listed, but only abnormal results are displayed) Labs Reviewed - No data to display  EKG   Radiology No results found.  Procedures Procedures (including critical care time)  Medications Ordered in UC Medications - No data to display  Initial Impression / Assessment and Plan / UC Course  I have reviewed the triage vital signs and the nursing notes.  Pertinent labs & imaging results that were available during my care of the patient were reviewed by me and considered in my medical decision making (see chart for details).   52 year old female presents for nasal congestion, sinus pain/pressure and cough x 10 days.  No fever.  No improvement of symptoms with OTC meds.  Clinical presentation may be consistent with acute bacterial sinusitis but I also discussed with patient that this is more likely related to a viral illness and will need to run its course.  Supportive care encouraged with increased rest and fluids.  Sent and nasal spray and advised her to start Allegra-D over-the-counter.  She does report a history of allergies.  I sent azithromycin to pharmacy since she has allergy to penicillins and doxycycline.  Reviewed return precautions.   Final Clinical Impressions(s) / UC Diagnoses   Final diagnoses:  Acute sinusitis, recurrence not specified, unspecified location  Acute cough     Discharge Instructions      -Start Allegra D and the nasal spray I sent -Take antibiotics, but do realize that this could be a cold virus that is lasting awhile and will need to run the course. -Return if fever or worsening symptoms.     ED Prescriptions     Medication Sig Dispense Auth. Provider   azithromycin (ZITHROMAX) 250 MG tablet  Take 1 tablet (250 mg total) by mouth daily. Take first 2 tablets together, then 1 every [place] until finished. 6 tablet Eusebio Friendly B, PA-C   ipratropium (ATROVENT) 0.06 % nasal spray Place 2 sprays into both nostrils 4 (four) times daily. 15 mL Shirlee Latch, PA-C      PDMP not reviewed this encounter.   Shirlee Latch, PA-C 05/07/23 1243

## 2023-05-07 NOTE — ED Triage Notes (Signed)
Pt c/o head pressure, cough, ear pressure onset x7 days ago. Pt has tried mucinex and Tietje quil with no relief.

## 2023-05-07 NOTE — Discharge Instructions (Signed)
-  Start Allegra D and the nasal spray I sent -Take antibiotics, but do realize that this could be a cold virus that is lasting awhile and will need to run the course. -Return if fever or worsening symptoms.

## 2023-05-08 ENCOUNTER — Ambulatory Visit: Payer: Self-pay

## 2023-06-10 ENCOUNTER — Ambulatory Visit: Payer: BC Managed Care – PPO | Admitting: Obstetrics and Gynecology

## 2023-06-10 ENCOUNTER — Encounter: Payer: Self-pay | Admitting: Obstetrics and Gynecology

## 2023-06-10 VITALS — BP 137/78 | HR 70 | Resp 16 | Ht 64.0 in | Wt 180.0 lb

## 2023-06-10 DIAGNOSIS — N951 Menopausal and female climacteric states: Secondary | ICD-10-CM

## 2023-06-10 DIAGNOSIS — R102 Pelvic and perineal pain: Secondary | ICD-10-CM | POA: Diagnosis not present

## 2023-06-10 DIAGNOSIS — Z8742 Personal history of other diseases of the female genital tract: Secondary | ICD-10-CM | POA: Diagnosis not present

## 2023-06-10 DIAGNOSIS — Z90710 Acquired absence of both cervix and uterus: Secondary | ICD-10-CM | POA: Diagnosis not present

## 2023-06-10 NOTE — Progress Notes (Signed)
    GYNECOLOGY PROGRESS NOTE  Subjective:    Patient ID: Heidi Pitts, female    DOB: August 27, 1971, 52 y.o.   MRN: [id number]  HPI  Patient is a 52 y.o. G0P0000 female who presents for evaluation of pelvic pain. She is status post hysterectomy. She reports that she feels constant pressure and a sharp pain in her pelvic area x 2-3 months. She feels pain more on her left side. Bending over or laying down with her feet up relieves the pain and physical activity makes it worse. She started having hot flashes again in January.   Of note, also reports a different type of pain that occurs in the lower middle of her stomach occasionally. Notes that she was evaluated by GI, and was prescribed Miralax which has helped some. This pain had onset prior to new episode of pain.   The following portions of the patient's history were reviewed and updated as appropriate: allergies, current medications, past family history, past medical history, past social history, past surgical history, and problem list.  Review of Systems Pertinent items noted in HPI and remainder of comprehensive ROS otherwise negative.   Objective:   Blood pressure 137/78, pulse 70, resp. rate 16, height 5\' 4"  (1.626 m), weight 180 lb (81.6 kg). Body mass index is 30.9 kg/m. General appearance: alert, cooperative, and no distress Abdomen:  soft, mildly tender to palpation in LLQ; bowel sounds normal; no masses,  no organomegaly Pelvic: external genitalia normal, rectovaginal septum normal.  Vagina with small amount of thin white discharge.  Speculum exam not performed. Bimanual exam without vaginal tenderness. Uterus surgically absent. Adnexae non-palpable, but left adenexa tender.   Extremities: extremities normal, atraumatic, no cyanosis or edema Neurologic: Grossly normal   Assessment:   1. Pelvic pain   2. History of hysterectomy   3. History of endometriosis   4. Menopausal vasomotor syndrome      Plan:   1. Pelvic pain -  Unclear cause at this time, will order pelvic ultrasound to assess for adnexal masses in light of left-sided tenderness. - US PELVIC COMPLETE WITH TRANSVAGINAL; Future  2. History of hysterectomy   3. History of endometriosis - Less likely due to patient's age and now exhibiting symptoms of menopause, but could be a cause of patient's pain if reactivation occurred.   4. Menopausal vasomotor syndrome - Patient now experiencing vasomotor symptoms, most likely transitioning to menopausal status. Discussed lifestyle interventions such as wearing light clothing, remaining in cool environments, having fan/air conditioner in the room, avoiding hot beverages etc.  Discussed using hormone therapy and concerns about increased risk of heart disease, cerebrovascular disease, thromboembolic disease,  and breast cancer.  Also discussed other medical options such as Paxil, Effexor, Brisdelle or Neurontin.  There is also a new nonhormonal medication called Veozah, which is FDA-approved for menopausal vasomotor symptoms.   Also discussed alternative therapies such as herbal remedies but cautioned that most of the products contained phytoestrogens (plant estrogens) in unregulated amounts which can have the same effects on the body as the pharmaceutical estrogen preparations.  Also referred her to www.menopause.org for other alternative options.  Patient opted for herbal remedies. To follow up if no improvement in symptoms.   Patient is overdue for routine wellness exam, can schedule.   Hildred Laser, MD [facility]

## 2023-06-12 ENCOUNTER — Encounter: Payer: Self-pay | Admitting: Obstetrics and Gynecology

## 2023-06-12 ENCOUNTER — Ambulatory Visit
Admission: RE | Admit: 2023-06-12 | Discharge: 2023-06-12 | Disposition: A | Discharge status: Home / Self Care | Payer: BC Managed Care – PPO | Source: Ambulatory Visit | Attending: Obstetrics and Gynecology | Admitting: Obstetrics and Gynecology

## 2023-06-12 DIAGNOSIS — R102 Pelvic and perineal pain: Secondary | ICD-10-CM | POA: Diagnosis present

## 2023-06-19 ENCOUNTER — Other Ambulatory Visit: Payer: Self-pay | Admitting: Internal Medicine

## 2023-06-19 DIAGNOSIS — R1084 Generalized abdominal pain: Secondary | ICD-10-CM

## 2023-07-01 ENCOUNTER — Ambulatory Visit
Admission: RE | Admit: 2023-07-01 | Discharge: 2023-07-01 | Disposition: A | Discharge status: Home / Self Care | Payer: BC Managed Care – PPO | Source: Ambulatory Visit | Attending: Internal Medicine | Admitting: Internal Medicine

## 2023-07-01 DIAGNOSIS — R1084 Generalized abdominal pain: Secondary | ICD-10-CM | POA: Diagnosis present

## 2023-07-01 MED ORDER — IOHEXOL 300 MG/ML  SOLN
100.0000 mL | Freq: Once | INTRAMUSCULAR | Status: AC | PRN
  Administered 2023-07-01: 100 mL via INTRAVENOUS

## 2023-11-24 ENCOUNTER — Ambulatory Visit
Admission: EM | Admit: 2023-11-24 | Discharge: 2023-11-24 | Disposition: A | Discharge status: Home / Self Care | Payer: 59

## 2023-11-24 DIAGNOSIS — J0100 Acute maxillary sinusitis, unspecified: Secondary | ICD-10-CM | POA: Diagnosis not present

## 2023-11-24 MED ORDER — CEFDINIR 300 MG PO CAPS: 300.0000 mg | ORAL_CAPSULE | Freq: Two times a day (BID) | ORAL | 0 refills | Status: AC

## 2023-11-24 MED ORDER — PROMETHAZINE-DM 6.25-15 MG/5ML PO SYRP: 5.0000 mL | ORAL_SOLUTION | Freq: Every evening | ORAL | 0 refills | Status: AC | PRN

## 2023-11-24 MED ORDER — BENZONATATE 100 MG PO CAPS
100.0000 mg | ORAL_CAPSULE | Freq: Three times a day (TID) | ORAL | 0 refills | Status: AC
Start: 2023-11-24 — End: ?

## 2023-11-24 NOTE — Discharge Instructions (Signed)
 Today you are been treated for a sinus infection, on exam there is no redness or Bentlee Drier patches to your throat and I have a low suspicion that you have strep therefore we have held off on testing, you do have tenderness to your lymph nodes which are part of our drainage system, can swell as they attempted during which causes pain, may hold warm compresses over the affected area as well as massage as tolerated  Begin cefdinir  every morning and every evening for 10 days to clear any germs  May use Tessalon  pill every 8 hours as needed for coughing, may use cough syrup at bedtime for comfort    You can take Tylenol  and/or Ibuprofen  as needed for fever reduction and pain relief.   For cough: honey 1/2 to 1 teaspoon (you can dilute the honey in water or another fluid).  You can also use guaifenesin and dextromethorphan for cough. You can use a humidifier for chest congestion and cough.  If you don't have a humidifier, you can sit in the bathroom with the hot shower running.      For sore throat: try warm salt water gargles, cepacol lozenges, throat spray, warm tea or water with lemon/honey, popsicles or ice, or OTC cold relief medicine for throat discomfort.   For congestion: take a daily anti-histamine like Zyrtec, Claritin, and a oral decongestant, such as pseudoephedrine.  You can also use Flonase 1-2 sprays in each nostril daily.   It is important to stay hydrated: drink plenty of fluids (water, gatorade/powerade/pedialyte, juices, or teas) to keep your throat moisturized and help further relieve irritation/discomfort.

## 2023-11-24 NOTE — ED Provider Notes (Addendum)
 [facility]    CSN: [id number] Arrival date & time: 11/24/23  1646      History   Chief Complaint Chief Complaint  Patient presents with   Sore Throat    HPI Maitlyn Penza Borum is a 53 y.o. female.   Patient presents for evaluation of nasal congestion, rhinorrhea, productive cough, sinus pain and pressure along the bilateral cheeks into the center of the face present for 2 weeks.  Beginning 1 Sherrill ago began to experience worsening sore throat and pain and fullness to the left ear.  Has been taking DayQuil NyQuil and Flonase.  Denies fever.  Past Medical History:  Diagnosis Date   Anxiety    Endometriosis    Migraines    Pelvic pain in female    Chronic rt lower qaudrant pain   Perimenopausal vasomotor symptoms    Periodic fever syndrome [facility])     Patient Active Problem List   Diagnosis Date Noted   Acute upper respiratory infection 09/04/2021   Endometriosis 02/13/2016   Status post laparoscopic supracervical hysterectomy 02/13/2016   Status post trachelectomy 02/13/2016   Dyslipidemia 02/13/2016   Headache, migraine 02/13/2016   Colon, diverticulosis 12/25/2015    Past Surgical History:  Procedure Laterality Date   ABDOMINAL HYSTERECTOMY     rso   APPENDECTOMY     CHOLECYSTECTOMY     LAPAROSCOPIC LYSIS OF ADHESIONS     vaginal trachelectomy  08/2013    OB History     Gravida  0   Para  0   Term  0   Preterm  0   AB  0   Living  0      SAB  0   IAB  0   Ectopic  0   Multiple  0   Live Births               Home Medications    Prior to Admission medications   Medication Sig Start Date End Date Taking? Authorizing Provider  benzonatate  (TESSALON ) 100 MG capsule Take 1 capsule (100 mg total) by mouth every 8 (eight) hours. 11/24/23  Yes Jerremy Maione R, NP  cefdinir  (OMNICEF ) 300 MG capsule Take 1 capsule (300 mg total) by mouth 2 (two) times daily for 10 days. 11/24/23 12/04/23 Yes Amy Gothard, Shelba SAUNDERS, NP   promethazine -dextromethorphan (PROMETHAZINE -DM) 6.25-15 MG/5ML syrup Take 5 mLs by mouth at bedtime as needed. 11/24/23  Yes Daril Warga R, NP  rosuvastatin (CRESTOR) 10 MG tablet Take 1 tablet by mouth daily. 07/11/23 07/10/24 Yes [provider]  albuterol  (VENTOLIN  HFA) 108 (90 Base) MCG/ACT inhaler Inhale 2 puffs into the lungs every 4 (four) hours as needed for wheezing or shortness of breath. 09/04/21   Defelice, Jeanette, NP  busPIRone (BUSPAR) 7.5 MG tablet  10/07/20   [provider]  butalbital -acetaminophen -caffeine  (FIORICET) 50-325-40 MG tablet Take 2 tablets every 6 hours as needed for headache, do not exceed 6 tablets in 24 hours. 09/04/21   Defelice, Jeanette, NP  EPINEPHrine (EPIPEN 2-PAK) 0.3 mg/0.3 mL IJ SOAJ injection Inject 0.3 mg into the muscle as needed.    [provider]  ipratropium (ATROVENT ) 0.06 % nasal spray Place 2 sprays into both nostrils 4 (four) times daily. 05/07/23   Arvis Jolan NOVAK, PA-C  predniSONE  (DELTASONE ) 10 MG tablet Take 10 mg by mouth as needed. 12/07/21   [provider]  promethazine  (PHENERGAN ) 25 MG suppository  01/29/19   [provider]  valACYclovir (VALTREX) 500 MG  tablet  12/25/15   [provider]  ZOLMitriptan (ZOMIG) 2.5 MG tablet Take 2.5 mg by mouth as needed for migraine or headache. May repeat in 2 hours if headache persists or recurs.    [provider]    Family History Family History  Problem Relation Age of Onset   Heart disease Father    Diabetes Father    Bladder Cancer Maternal Aunt    Breast cancer Paternal Aunt    Pancreatic cancer Maternal Grandmother    Colon cancer Maternal Grandmother    Breast cancer Paternal Grandmother    Ovarian cancer Neg Hx     Social History Social History   Tobacco Use   Smoking status: Never   Smokeless tobacco: Never  Vaping Use   Vaping status: Never Used  Substance Use Topics   Alcohol use: Yes    Comment: rare    Drug use: No     Allergies   Doxycycline, Fish allergy, Penicillins, and Amoxicillin-pot clavulanate   Review of Systems Review of Systems   Physical Exam Triage Vital Signs ED Triage Vitals  Encounter Vitals Group     BP 11/24/23 1731 125/82     Systolic BP Percentile --      Diastolic BP Percentile --      Pulse Rate 11/24/23 1731 75     Resp 11/24/23 1731 18     Temp 11/24/23 1731 98.1 F (36.7 C)     Temp src --      SpO2 11/24/23 1731 98 %     Weight --      Height --      Head Circumference --      Peak Flow --      Pain Score 11/24/23 1729 9     Pain Loc --      Pain Education --      Exclude from Growth Chart --    No data found.  Updated Vital Signs BP 125/82   Pulse 75   Temp 98.1 F (36.7 C)   Resp 18   SpO2 98%   Visual Acuity Right Eye Distance:   Left Eye Distance:   Bilateral Distance:    Right Eye Near:   Left Eye Near:    Bilateral Near:     Physical Exam Constitutional:      Appearance: Normal appearance.  HENT:     Right Ear: Tympanic membrane, ear canal and external ear normal.     Left Ear: Tympanic membrane, ear canal and external ear normal.     Nose: Congestion present. No rhinorrhea.     Right Sinus: Maxillary sinus tenderness present.     Left Sinus: Maxillary sinus tenderness present.     Mouth/Throat:     Mouth: Mucous membranes are moist.     Pharynx: Oropharynx is clear. No oropharyngeal exudate or posterior oropharyngeal erythema.  Eyes:     Extraocular Movements: Extraocular movements intact.  Cardiovascular:     Rate and Rhythm: Normal rate and regular rhythm.     Pulses: Normal pulses.     Heart sounds: Normal heart sounds.  Pulmonary:     Effort: Pulmonary effort is normal.     Breath sounds: Normal breath sounds.  Musculoskeletal:     Cervical back: Normal range of motion.  Lymphadenopathy:     Cervical: Cervical adenopathy present.  Neurological:     Mental Status: She is alert and oriented to person,  place, and time. Mental status is at  baseline.      UC Treatments / Results  Labs (all labs ordered are listed, but only abnormal results are displayed) Labs Reviewed - No data to display  EKG   Radiology No results found.  Procedures Procedures (including critical care time)  Medications Ordered in UC Medications - No data to display  Initial Impression / Assessment and Plan / UC Course  I have reviewed the triage vital signs and the nursing notes.  Pertinent labs & imaging results that were available during my care of the patient were reviewed by me and considered in my medical decision making (see chart for details).  Acute nonrecurrent maxillary sinusitis  Patient is in no signs of distress nor toxic appearing.  Vital signs are stable.  Low suspicion for pneumonia, pneumothorax or bronchitis and therefore will defer imaging.  Symptoms present for 2 weeks, consistent with a sinusitis, no erythema, tonsillar adenopathy or exudate noted to the oropharynx therefore strep testing deferred. prescribed cefdinir , Tessalon .May use additional over-the-counter medications as needed for supportive care.  May follow-up with urgent care as needed if symptoms persist or worsen.  Note given.    Final diagnoses:  Acute non-recurrent maxillary sinusitis     Discharge Instructions      Today you are been treated for a sinus infection, on exam there is no redness or Madelyne Millikan patches to your throat and I have a low suspicion that you have strep therefore we have held off on testing, you do have tenderness to your lymph nodes which are part of our drainage system, can swell as they attempted during which causes pain, may hold warm compresses over the affected area as well as massage as tolerated  Begin cefdinir  every morning and every evening for 10 days to clear any germs  May use Tessalon  pill every 8 hours as needed for coughing, may use cough syrup at bedtime for comfort    You can take  Tylenol  and/or Ibuprofen  as needed for fever reduction and pain relief.   For cough: honey 1/2 to 1 teaspoon (you can dilute the honey in water or another fluid).  You can also use guaifenesin and dextromethorphan for cough. You can use a humidifier for chest congestion and cough.  If you don't have a humidifier, you can sit in the bathroom with the hot shower running.      For sore throat: try warm salt water gargles, cepacol lozenges, throat spray, warm tea or water with lemon/honey, popsicles or ice, or OTC cold relief medicine for throat discomfort.   For congestion: take a daily anti-histamine like Zyrtec, Claritin, and a oral decongestant, such as pseudoephedrine.  You can also use Flonase 1-2 sprays in each nostril daily.   It is important to stay hydrated: drink plenty of fluids (water, gatorade/powerade/pedialyte, juices, or teas) to keep your throat moisturized and help further relieve irritation/discomfort.    ED Prescriptions     Medication Sig Dispense Auth. Provider   cefdinir  (OMNICEF ) 300 MG capsule Take 1 capsule (300 mg total) by mouth 2 (two) times daily for 10 days. 20 capsule Devaunte Gasparini R, NP   benzonatate  (TESSALON ) 100 MG capsule Take 1 capsule (100 mg total) by mouth every 8 (eight) hours. 21 capsule Azura Tufaro R, NP   promethazine -dextromethorphan (PROMETHAZINE -DM) 6.25-15 MG/5ML syrup Take 5 mLs by mouth at bedtime as needed. 118 mL Gregory Barrick, Shelba SAUNDERS, NP      PDMP not reviewed this encounter.   Teresa Shelba SAUNDERS, [place] 11/25/23 [phone]  Teresa Shelba SAUNDERS, NP 11/25/23 0823    Teresa Shelba SAUNDERS, NP 11/25/23 [phone]

## 2023-11-24 NOTE — ED Triage Notes (Signed)
 Patient to Urgent Care with complaints of sinus congestion/ sinus pain/ facial pressure.   Symptoms x2 weeks. Woke up today with pain in her throat/ pain and tenderness on the left side of her throat and ears.   Taking dayquil/ nyquil/ flonase. Reports her pcp recommended a strep test.

## 2024-06-29 NOTE — Progress Notes (Signed)
 ENCOUNTER: Patient Class :No patient class for patient encounter Department: [facility] [address] [place] [address]  PATIENT: Patient Demographics      Name Patient ID SSN Gender Identity Birth Date   Heidi Pitts, Heidi Pitts [id number] Female 18-Jan-2071 (53 yrs)          Address Phone Email       [address] [place] [phone] [email or web address]) [email or web address]            [facility] Caucasian/White             Reg Status PCP Date Last Verified Next Review Date     ELAPSED Fernande Ophelia Marinell DOUGLAS [phone] 05/25/24 06/24/24           Marital Status Religion Language       Married Christian English              EMERGENCY CONTACT: Name Relationship Lgl Grd Work Administrator, sports  1. GUYER,PEGGY Parent    [phone]  2. Arenivas,WENDY Other   [phone]     GUARANTOR: There is no guarantor information entered for this encounter.  COVERAGE: Primary Visit Coverage      Payer Plan Group Number Group Name Payer Phone Plan Phone   No coverage found                Secondary Visit Coverage      Payer Plan Group Number Group Name Payer Phone Plan Phone   No coverage found                Primary Coverage      Payer Plan Group Number Group Name Payer Phone Plan Phone   [facility] [place] [id number] [place]  [employer]  [phone]           Primary Subscriber      Subscriber ID Subscriber Name Subscriber [facility] Subscriber Address   [facility] Buechele,Vivianna M [id number] [address]      [place], [place] [address]           Secondary Coverage      Payer Plan Group Number Group Name Payer Phone Plan Phone   No coverage found

## 2024-07-07 ENCOUNTER — Inpatient Hospital Stay: Attending: Oncology | Admitting: Oncology

## 2024-07-07 ENCOUNTER — Inpatient Hospital Stay

## 2024-07-07 ENCOUNTER — Encounter: Payer: Self-pay | Admitting: Oncology

## 2024-07-07 VITALS — BP 126/70 | HR 68 | Temp 97.9°F | Resp 18 | Ht 64.0 in | Wt 180.0 lb

## 2024-07-07 DIAGNOSIS — Z803 Family history of malignant neoplasm of breast: Secondary | ICD-10-CM | POA: Insufficient documentation

## 2024-07-07 DIAGNOSIS — Z800 Family history of malignant neoplasm of digestive organs: Secondary | ICD-10-CM | POA: Insufficient documentation

## 2024-07-07 DIAGNOSIS — R9230 Dense breasts, unspecified: Secondary | ICD-10-CM | POA: Insufficient documentation

## 2024-07-07 DIAGNOSIS — Z9189 Other specified personal risk factors, not elsewhere classified: Secondary | ICD-10-CM

## 2024-07-07 NOTE — Progress Notes (Unsigned)
 If she has to have a MRI, she will need to have some anxiety medication. Pain in the upper right side of her shoulder and shoulder blade. Headaches, not like the migraines she is used to having left side of the front her head. Tiredness.

## 2024-07-07 NOTE — Progress Notes (Unsigned)
 [facility]  [phone] [phone]  ID: Silvano HERO Sargent OB: 1971/10/21  MR#: [id number]  RDW#:[id number]  Patient Care Team: Fernande Ophelia JINNY DOUGLAS, MD as PCP - General (Internal Medicine)  CHIEF COMPLAINT: High risk breast.  INTERVAL HISTORY: Patient is a 53 year old female who has a reported significant family history of malignancy and is referred for further evaluation.  She is anxious, but otherwise feels well.  She has no neurologic complaints.  She denies any recent fevers or illnesses.  She has a good appetite and denies weight loss.  She has no chest pain, shortness of breath, cough, or hemoptysis.  She denies any nausea, vomiting, constipation, or diarrhea.  She has no urinary complaints.  Patient offers no further specific complaints today.  REVIEW OF SYSTEMS:   Review of Systems  Constitutional: Negative.  Negative for fever, malaise/fatigue and weight loss.  Respiratory: Negative.  Negative for cough, hemoptysis and shortness of breath.   Cardiovascular: Negative.  Negative for chest pain and leg swelling.  Gastrointestinal: Negative.  Negative for abdominal pain.  Genitourinary: Negative.  Negative for dysuria.  Musculoskeletal: Negative.  Negative for back pain.  Skin: Negative.  Negative for rash.  Neurological: Negative.  Negative for dizziness, focal weakness, weakness and headaches.  Psychiatric/Behavioral: Negative.  The patient is not nervous/anxious.     As per HPI. Otherwise, a complete review of systems is negative.  PAST MEDICAL HISTORY: Past Medical History:  Diagnosis Date   Anxiety    Endometriosis    Migraines    Pelvic pain in female    Chronic rt lower qaudrant pain   Perimenopausal vasomotor symptoms    Periodic fever syndrome (HCC)     PAST SURGICAL HISTORY: Past Surgical History:  Procedure Laterality Date   ABDOMINAL HYSTERECTOMY     rso   APPENDECTOMY     CHOLECYSTECTOMY     LAPAROSCOPIC LYSIS OF  ADHESIONS     vaginal trachelectomy  08/2013    FAMILY HISTORY: Family History  Problem Relation Age of Onset   Heart disease Father    Diabetes Father    Bladder Cancer Maternal Aunt    Breast cancer Paternal Aunt    Pancreatic cancer Maternal Grandmother    Colon cancer Maternal Grandmother    Breast cancer Paternal Grandmother    Ovarian cancer Neg Hx     ADVANCED DIRECTIVES (Y/N):  N  HEALTH MAINTENANCE: Social History   Tobacco Use   Smoking status: Never   Smokeless tobacco: Never  Vaping Use   Vaping status: Never Used  Substance Use Topics   Alcohol use: Yes    Comment: rare   Drug use: No     Colonoscopy:  PAP:  Bone density:  Lipid panel:  Allergies  Allergen Reactions   Doxycycline Rash   Fish Allergy Anaphylaxis   Penicillins Rash   Amoxicillin-Pot Clavulanate Other (See Comments)    Current Outpatient Medications  Medication Sig Dispense Refill   albuterol  (VENTOLIN  HFA) 108 (90 Base) MCG/ACT inhaler Inhale 2 puffs into the lungs every 4 (four) hours as needed for wheezing or shortness of breath. 1 each 0   benzonatate  (TESSALON ) 100 MG capsule Take 1 capsule (100 mg total) by mouth every 8 (eight) hours. 21 capsule 0   busPIRone (BUSPAR) 7.5 MG tablet      butalbital -acetaminophen -caffeine  (FIORICET) 50-325-40 MG tablet Take 2 tablets every 6 hours as needed for headache, do not exceed 6 tablets in 24 hours. 20 tablet 0  EPINEPHrine (EPIPEN 2-PAK) 0.3 mg/0.3 mL IJ SOAJ injection Inject 0.3 mg into the muscle as needed.     estradiol (VIVELLE-DOT) 0.05 MG/24HR patch 1 patch 2 (two) times a week.     ipratropium (ATROVENT ) 0.06 % nasal spray Place 2 sprays into both nostrils 4 (four) times daily. 15 mL 0   predniSONE  (DELTASONE ) 10 MG tablet Take 10 mg by mouth as needed.     promethazine  (PHENERGAN ) 25 MG suppository      promethazine -dextromethorphan (PROMETHAZINE -DM) 6.25-15 MG/5ML syrup Take 5 mLs by mouth at bedtime as needed. 118 mL 0    rosuvastatin (CRESTOR) 10 MG tablet Take 1 tablet by mouth daily.     valACYclovir (VALTREX) 500 MG tablet   4   ZOLMitriptan (ZOMIG) 2.5 MG tablet Take 2.5 mg by mouth as needed for migraine or headache. May repeat in 2 hours if headache persists or recurs.     No current facility-administered medications for this visit.    OBJECTIVE: Vitals:   07/07/24 1408  BP: 126/70  Pulse: 68  Resp: 18  Temp: 97.9 F (36.6 C)  SpO2: 100%     Body mass index is 30.9 kg/m.    ECOG FS:0 - Asymptomatic  General: Well-developed, well-nourished, no acute distress. Eyes: Pink conjunctiva, anicteric sclera. HEENT: Normocephalic, moist mucous membranes. Lungs: No audible wheezing or coughing. Heart: Regular rate and rhythm. Abdomen: Soft, nontender, no obvious distention. Musculoskeletal: No edema, cyanosis, or clubbing. Neuro: Alert, answering all questions appropriately. Cranial nerves grossly intact. Skin: No rashes or petechiae noted. Psych: Normal affect. Lymphatics: No cervical, calvicular, axillary or inguinal LAD.   LAB RESULTS:  Lab Results  Component Value Date   NA 138 10/31/2015   K 3.6 10/31/2015   CL 104 10/31/2015   CO2 25 10/31/2015   GLUCOSE 96 10/31/2015   BUN 12 10/31/2015   CREATININE 0.59 10/31/2015   CALCIUM 9.3 10/31/2015   PROT 7.5 10/31/2015   ALBUMIN 4.6 10/31/2015   AST 18 10/31/2015   ALT 12 (L) 10/31/2015   ALKPHOS 59 10/31/2015   BILITOT 0.5 10/31/2015   GFRNONAA >60 10/31/2015   GFRAA >60 10/31/2015    Lab Results  Component Value Date   WBC 6.8 10/31/2015   NEUTROABS 5.5 10/31/2015   HGB 13.2 10/31/2015   HCT 39.0 10/31/2015   MCV 90.3 10/31/2015   PLT 189 10/31/2015     STUDIES: No results found.  ASSESSMENT: High risk breast.  PLAN:    High risk breast: Patient has family history of malignancy and referral has been made to genetic counseling for further evaluation.  Her Tyrer-Cuzick score is 6.8% therefore she does not require  tamoxifen prophylaxis.  Given her dense breast tissue on recent mammogram, is reasonable to consider yearly MRI.  Patient reports she has claustrophobia and anxiety and cannot tolerate MRIs.  Patient has been instructed to be vigilant about continuing her yearly screening mammograms.  No further intervention is needed.  No follow-up has been scheduled. HRT: Treatment per OB/GYN.  Patient has been previously instructed on the increased risk of breast cancer, cardiovascular disease, etc.  I spent a total of 60 minutes reviewing chart data, face-to-face evaluation with the patient, counseling and coordination of care as detailed above.    Patient expressed understanding and was in agreement with this plan. She also understands that She can call clinic at any time with any questions, concerns, or complaints.    Evalene JINNY Reusing, MD   07/08/2024 12:15 PM

## 2024-08-04 ENCOUNTER — Encounter: Payer: Self-pay | Admitting: Licensed Clinical Social Worker

## 2024-08-04 ENCOUNTER — Other Ambulatory Visit: Payer: Self-pay | Admitting: Licensed Clinical Social Worker

## 2024-08-04 ENCOUNTER — Inpatient Hospital Stay

## 2024-08-04 ENCOUNTER — Inpatient Hospital Stay: Attending: Oncology | Admitting: Licensed Clinical Social Worker

## 2024-08-04 DIAGNOSIS — Z8052 Family history of malignant neoplasm of bladder: Secondary | ICD-10-CM | POA: Diagnosis not present

## 2024-08-04 DIAGNOSIS — Z1379 Encounter for other screening for genetic and chromosomal anomalies: Secondary | ICD-10-CM

## 2024-08-04 DIAGNOSIS — Z803 Family history of malignant neoplasm of breast: Secondary | ICD-10-CM | POA: Diagnosis not present

## 2024-08-04 DIAGNOSIS — Z800 Family history of malignant neoplasm of digestive organs: Secondary | ICD-10-CM

## 2024-08-04 LAB — GENETIC SCREENING ORDER

## 2024-08-04 NOTE — Progress Notes (Signed)
 REFERRING PROVIDER: Jacobo Evalene PARAS, MD [address] [place],  [place] [address]  PRIMARY PROVIDER:  Fernande Ophelia PARAS DOUGLAS, MD  PRIMARY REASON FOR VISIT:  1. Family history of pancreatic cancer   2. Family history of colon cancer   3. Family history of breast cancer   4. Family history of bladder cancer      HISTORY OF PRESENT ILLNESS:   Ms. 37, a 53 y.o. female, was seen for a [place] cancer genetics consultation at the request of Dr. Jacobo due to a family history of cancer.  Ms. Dial presents to clinic today to discuss the possibility of a hereditary predisposition to cancer, genetic testing, and to further clarify her future cancer risks, as well as potential cancer risks for family members.   CANCER HISTORY:  Ms. Pangborn is a 53 y.o. female with no personal history of cancer.     RELEVANT MEDICAL HISTORY:  Menarche was at age 35.  Ovaries intact: has left ovary.  Hysterectomy: yes.  Menopausal status: postmenopausal.  Colonoscopy: yes; has had 3 total, reports a few polyps on each. Mammogram within the last year: yes.   Past Medical History:  Diagnosis Date   Anxiety    Endometriosis    Migraines    Pelvic pain in female    Chronic rt lower qaudrant pain   Perimenopausal vasomotor symptoms    Periodic fever syndrome (HCC)     Past Surgical History:  Procedure Laterality Date   ABDOMINAL HYSTERECTOMY     rso   APPENDECTOMY     CHOLECYSTECTOMY     LAPAROSCOPIC LYSIS OF ADHESIONS     vaginal trachelectomy  08/2013    FAMILY HISTORY:  We obtained a detailed, 4-generation family history.  Significant diagnoses are listed below: Family History  Problem Relation Age of Onset   Heart disease Father    Diabetes Father    Colon cancer Maternal Aunt        dx 32s   Melanoma Maternal Aunt    Pancreatic cancer Maternal Uncle        dx late 4s   Dementia Maternal Uncle    Melanoma Maternal Uncle    Pancreatic cancer Maternal Grandmother        dx 60    Breast cancer Paternal Grandmother        dx >50   Breast cancer Other    Bladder Cancer Maternal Cousin 26   Breast cancer Paternal Great-grandmother    Colon cancer Paternal Great-grandmother    Ovarian cancer Neg Hx    Ms. Mckinstry has 1 paternal half sister, 69, no cancers.   Ms. Stribling mother is living at 54. Maternal uncle had pancreatic cancer in his 37s that was detected early, he had Whipple procedure and passed of dementia in his 49s. Maternal aunt had colon cancer in her 30s and passed of it. Another maternal uncle had melanoma. Another maternal aunt had melanoma. Maternal cousin had bladder cancer ar 35 and is living at 11. Maternal grandmother had pancreatic cancer at 29 and passed at 34.  Ms. Riggenbach father passed at 76. Paternal grandmother had breast cancer over age 7 and passed at 57. Her mother had breast and colon cancer. Paternal grandfather's sister had breast cancer, and possible that his mother had breast cancer as well.   Ms. Shankman is unaware of previous family history of genetic testing for hereditary cancer risks. There is no reported Ashkenazi Jewish ancestry. There is no known consanguinity.  GENETIC COUNSELING ASSESSMENT: Ms. Dampier is a 53 y.o. female with a family history of cancer which is somewhat suggestive of a hereditary cancer syndrome and predisposition to cancer. We, therefore, discussed and recommended the following at today's visit.   DISCUSSION: We discussed that, in general, most cancer is not inherited in families, but instead is sporadic or familial. Sporadic cancers occur by chance and typically happen at older ages (>50 years) as this type of cancer is caused by genetic changes acquired during an individual's lifetime. Some families have more cancers than would be expected by chance; however, the ages or types of cancer are not consistent with a known genetic mutation or known genetic mutations have been ruled out. This type of familial cancer is thought to be  due to a combination of multiple genetic, environmental, hormonal, and lifestyle factors. While this combination of factors likely increases the risk of cancer, the exact source of this risk is not currently identifiable or testable.    We discussed that approximately 10% of cancer is hereditary. Most cases of hereditary breast cancer are associated with BRCA1/BRCA2 genes, although there are other genes associated with hereditary cancer as well, including the Lynch syndrome genes which can increase the risk for colon, pancreatic and bladder cancer. Cancers and risks are gene specific. We discussed that testing is beneficial for several reasons including knowing about cancer risks, identifying potential screening and risk-reduction options that may be appropriate, and to understand if other family members could be at risk for cancer and allow them to undergo genetic testing.   We reviewed the characteristics, features and inheritance patterns of hereditary cancer syndromes. We also discussed genetic testing, including the appropriate family members to test, the process of testing, insurance coverage and turn-around-time for results. We discussed the implications of a negative, positive and/or variant of uncertain significant result. We recommended Ms. Coull pursue genetic testing for the [facility] CancerNext-Expanded+RNA gene panel.   The CancerNext-Expanded gene panel offered by [facility] and includes sequencing, rearrangement, and RNA analysis for the following 77 genes: AIP, ALK, APC, ATM, AXIN2, BAP1, BARD1, BMPR1A, BRCA1, BRCA2, BRIP1, CDC73, CDH1, CDK4, CDKN1B, CDKN2A, CEBPA, CHEK2, CTNNA1, DDX41, DICER1, ETV6, FH, FLCN, GATA2, LZTR1, MAX, MBD4, MEN1, MET, MLH1, MSH2, MSH3, MSH6, MUTYH, NF1, NF2, NTHL1, PALB2, PHOX2B, PMS2, POT1, PRKAR1A, PTCH1, PTEN, RAD51C, RAD51D, RB1, RET, RPS20, RUNX1, SDHA, SDHAF2, SDHB, SDHC, SDHD, SMAD4, SMARCA4, SMARCB1, SMARCE1, STK11, SUFU, TMEM127, TP53, TSC1, TSC2, VHL, and  WT1 (sequencing and deletion/duplication); EGFR, HOXB13, KIT, MITF, PDGFRA, POLD1, and POLE (sequencing only); EPCAM and GREM1 (deletion/duplication only).   Based on Ms. Arment's family history of cancer, she meets medical criteria for genetic testing. Though Ms. Pennella is not personally affected, there are no affected family members that are willing/able to undergo hereditary cancer testing.  Therefore, Ms. Dayis the most informative family member available. Despite that she meets criteria, she may still have an out of pocket cost.   We discussed that some people do not want to undergo genetic testing due to fear of genetic discrimination.  A federal law called the Genetic Information Non-Discrimination Act (GINA) of 2008 helps protect individuals against genetic discrimination based on their genetic test results.  It impacts both health insurance and employment.  For health insurance, it protects against increased premiums, being kicked off insurance or being forced to take a test in order to be insured.  For employment it protects against hiring, firing and promoting decisions based on genetic test results.  Health status due  to a cancer diagnosis is not protected under GINA.  This law does not protect life insurance, disability insurance, or other types of insurance.   PLAN: After considering the risks, benefits, and limitations, Ms. Care provided informed consent to pursue genetic testing and the blood sample was sent to [facility] for analysis of the CancerNext-Expanded+RNA Panel. Results should be available within approximately 2-3 weeks' time, at which point they will be disclosed by telephone to Ms. Elem, as will any additional recommendations warranted by these results. Ms. Marich will receive a summary of her genetic counseling visit and a copy of her results once available. This information will also be available in Epic.  Ms. Schurman questions were answered to her satisfaction today. Our contact  information was provided should additional questions or concerns arise. Thank you for the referral and allowing us  to share in the care of your patient.   Dena Cary, MS, [facility] Genetic Counselor [email or web address] Phone: [phone]  45 minutes were spent on the date of the encounter in service to the patient including preparation, face-to-face consultation, documentation and care coordination. Patient's wife Sari was present. Dr. Delinda was available for discussion regarding this case.   _______________________________________________________________________ For Office Staff:  Number of people involved in session: 2 Was an Intern/ student involved with case: no

## 2024-08-12 ENCOUNTER — Telehealth: Payer: Self-pay | Admitting: Licensed Clinical Social Worker

## 2024-08-12 NOTE — Telephone Encounter (Signed)
 error

## 2024-08-12 NOTE — Telephone Encounter (Signed)
 I contacted Ms. Schulenburg to discuss her genetic testing results. No pathogenic variants were identified in the 77 genes analyzed. Detailed clinic note to follow.   The test report has been scanned into EPIC and is located under the Molecular Pathology section of the Results Review tab.  A portion of the result report is included below for reference.      Dena Cary, MS, [facility] Genetic Counselor [email or web address] Phone: [phone]
